# Patient Record
Sex: Female | Born: 1965 | Race: Black or African American | Hispanic: No | Smoking: Never smoker
Health system: Southern US, Community
[De-identification: ages and names within clinical notes are randomized; demographics above are authoritative.]

## PROBLEM LIST (undated history)

## (undated) DIAGNOSIS — T7840XA Allergy, unspecified, initial encounter: Secondary | ICD-10-CM

## (undated) DIAGNOSIS — G43909 Migraine, unspecified, not intractable, without status migrainosus: Secondary | ICD-10-CM

## (undated) DIAGNOSIS — I10 Essential (primary) hypertension: Secondary | ICD-10-CM

## (undated) DIAGNOSIS — M1990 Unspecified osteoarthritis, unspecified site: Secondary | ICD-10-CM

## (undated) DIAGNOSIS — E669 Obesity, unspecified: Secondary | ICD-10-CM

## (undated) DIAGNOSIS — F32A Depression, unspecified: Secondary | ICD-10-CM

## (undated) DIAGNOSIS — E559 Vitamin D deficiency, unspecified: Secondary | ICD-10-CM

## (undated) DIAGNOSIS — F329 Major depressive disorder, single episode, unspecified: Secondary | ICD-10-CM

## (undated) DIAGNOSIS — F439 Reaction to severe stress, unspecified: Secondary | ICD-10-CM

## (undated) HISTORY — DX: Major depressive disorder, single episode, unspecified: F32.9

## (undated) HISTORY — PX: MOUTH SURGERY: SHX715

## (undated) HISTORY — DX: Reaction to severe stress, unspecified: F43.9

## (undated) HISTORY — DX: Obesity, unspecified: E66.9

## (undated) HISTORY — PX: OTHER SURGICAL HISTORY: SHX169

## (undated) HISTORY — DX: Depression, unspecified: F32.A

## (undated) HISTORY — DX: Unspecified osteoarthritis, unspecified site: M19.90

## (undated) HISTORY — DX: Vitamin D deficiency, unspecified: E55.9

## (undated) HISTORY — DX: Allergy, unspecified, initial encounter: T78.40XA

## (undated) HISTORY — DX: Migraine, unspecified, not intractable, without status migrainosus: G43.909

---

## 1998-08-12 ENCOUNTER — Inpatient Hospital Stay (HOSPITAL_COMMUNITY): Admission: AD | Admit: 1998-08-12 | Discharge: 1998-08-12 | Payer: Self-pay | Admitting: Obstetrics and Gynecology

## 1999-10-19 ENCOUNTER — Ambulatory Visit: Admission: RE | Admit: 1999-10-19 | Discharge: 1999-10-19 | Payer: Self-pay | Admitting: Internal Medicine

## 1999-10-23 ENCOUNTER — Emergency Department (HOSPITAL_COMMUNITY): Admission: EM | Admit: 1999-10-23 | Discharge: 1999-10-23 | Payer: Self-pay | Admitting: Emergency Medicine

## 1999-12-04 ENCOUNTER — Emergency Department (HOSPITAL_COMMUNITY): Admission: EM | Admit: 1999-12-04 | Discharge: 1999-12-04 | Payer: Self-pay | Admitting: Emergency Medicine

## 2000-09-25 ENCOUNTER — Encounter (INDEPENDENT_AMBULATORY_CARE_PROVIDER_SITE_OTHER): Payer: Self-pay

## 2000-09-25 ENCOUNTER — Ambulatory Visit (HOSPITAL_COMMUNITY): Admission: RE | Admit: 2000-09-25 | Discharge: 2000-09-25 | Payer: Self-pay | Admitting: Obstetrics and Gynecology

## 2000-10-07 ENCOUNTER — Encounter: Payer: Self-pay | Admitting: Cardiovascular Disease

## 2000-10-07 ENCOUNTER — Ambulatory Visit (HOSPITAL_COMMUNITY): Admission: RE | Admit: 2000-10-07 | Discharge: 2000-10-07 | Payer: Self-pay | Admitting: Cardiovascular Disease

## 2000-10-13 ENCOUNTER — Encounter: Payer: Self-pay | Admitting: Cardiovascular Disease

## 2000-10-13 ENCOUNTER — Encounter: Admission: RE | Admit: 2000-10-13 | Discharge: 2000-10-13 | Payer: Self-pay | Admitting: Cardiovascular Disease

## 2001-09-25 ENCOUNTER — Encounter: Payer: Self-pay | Admitting: Internal Medicine

## 2001-09-25 ENCOUNTER — Ambulatory Visit (HOSPITAL_COMMUNITY): Admission: RE | Admit: 2001-09-25 | Discharge: 2001-09-25 | Payer: Self-pay | Admitting: Internal Medicine

## 2001-10-13 ENCOUNTER — Other Ambulatory Visit: Admission: RE | Admit: 2001-10-13 | Discharge: 2001-10-13 | Payer: Self-pay | Admitting: Obstetrics and Gynecology

## 2002-11-02 ENCOUNTER — Other Ambulatory Visit: Admission: RE | Admit: 2002-11-02 | Discharge: 2002-11-02 | Payer: Self-pay | Admitting: Obstetrics and Gynecology

## 2003-09-30 ENCOUNTER — Other Ambulatory Visit: Admission: RE | Admit: 2003-09-30 | Discharge: 2003-09-30 | Payer: Self-pay | Admitting: Obstetrics and Gynecology

## 2004-06-29 ENCOUNTER — Ambulatory Visit (HOSPITAL_COMMUNITY): Admission: RE | Admit: 2004-06-29 | Discharge: 2004-06-29 | Payer: Self-pay | Admitting: Cardiovascular Disease

## 2004-07-26 ENCOUNTER — Ambulatory Visit (HOSPITAL_COMMUNITY): Admission: RE | Admit: 2004-07-26 | Discharge: 2004-07-26 | Payer: Self-pay | Admitting: Obstetrics and Gynecology

## 2005-01-22 ENCOUNTER — Other Ambulatory Visit: Admission: RE | Admit: 2005-01-22 | Discharge: 2005-01-22 | Payer: Self-pay | Admitting: Obstetrics and Gynecology

## 2005-05-31 ENCOUNTER — Inpatient Hospital Stay (HOSPITAL_COMMUNITY): Admission: AD | Admit: 2005-05-31 | Discharge: 2005-06-03 | Payer: Self-pay | Admitting: Obstetrics and Gynecology

## 2005-06-05 ENCOUNTER — Ambulatory Visit (HOSPITAL_COMMUNITY): Admission: RE | Admit: 2005-06-05 | Discharge: 2005-06-05 | Payer: Self-pay | Admitting: Obstetrics and Gynecology

## 2005-06-05 ENCOUNTER — Ambulatory Visit: Payer: Self-pay | Admitting: Obstetrics and Gynecology

## 2005-06-28 ENCOUNTER — Inpatient Hospital Stay (HOSPITAL_COMMUNITY): Admission: AD | Admit: 2005-06-28 | Discharge: 2005-07-02 | Payer: Self-pay | Admitting: Obstetrics & Gynecology

## 2005-07-03 ENCOUNTER — Encounter: Admission: RE | Admit: 2005-07-03 | Discharge: 2005-08-02 | Payer: Self-pay | Admitting: Obstetrics and Gynecology

## 2005-08-03 ENCOUNTER — Encounter: Admission: RE | Admit: 2005-08-03 | Discharge: 2005-08-30 | Payer: Self-pay | Admitting: Obstetrics and Gynecology

## 2005-08-31 ENCOUNTER — Encounter: Admission: RE | Admit: 2005-08-31 | Discharge: 2005-09-10 | Payer: Self-pay | Admitting: Obstetrics and Gynecology

## 2007-07-25 ENCOUNTER — Emergency Department (HOSPITAL_COMMUNITY): Admission: EM | Admit: 2007-07-25 | Discharge: 2007-07-26 | Payer: Self-pay | Admitting: Emergency Medicine

## 2008-03-08 ENCOUNTER — Encounter: Admission: RE | Admit: 2008-03-08 | Discharge: 2008-03-08 | Payer: Self-pay | Admitting: Obstetrics and Gynecology

## 2009-04-13 ENCOUNTER — Ambulatory Visit (HOSPITAL_COMMUNITY): Admission: RE | Admit: 2009-04-13 | Discharge: 2009-04-13 | Payer: Self-pay | Admitting: Internal Medicine

## 2009-07-27 ENCOUNTER — Emergency Department (HOSPITAL_COMMUNITY): Admission: EM | Admit: 2009-07-27 | Discharge: 2009-07-27 | Payer: Self-pay | Admitting: Emergency Medicine

## 2010-02-12 ENCOUNTER — Emergency Department (HOSPITAL_COMMUNITY): Admission: EM | Admit: 2010-02-12 | Discharge: 2010-02-13 | Payer: Self-pay | Admitting: Emergency Medicine

## 2010-07-07 ENCOUNTER — Encounter: Payer: Self-pay | Admitting: Obstetrics and Gynecology

## 2010-09-26 ENCOUNTER — Encounter: Payer: Self-pay | Admitting: Internal Medicine

## 2010-11-02 NOTE — Op Note (Signed)
[facility]  Patient:    Kristin Lozano, Kristin Lozano Visit Number: [id number] MRN: [id number]          Service Type: DSU Location: [facility] Attending Physician:  Lendon Colonel Admit Date:  09/25/2000                             Operative Report  PREOPERATIVE DIAGNOSIS:       Menorrhagia and uterine fibroids.  POSTOPERATIVE DIAGNOSIS:      Menorrhagia and uterine fibroids.  OPERATION:                    Hysteroscopy with resection of a large submucous myoma.  SURGEON:                      Katherine Roan, M.D.  DESCRIPTION OF PROCEDURE:     Under satisfactory general anesthesia in the lithotomy position, the cervix was dilated. The hysteroscope was inserted into the uterus and visualization of the uterine cavity revealed a large, pedunculated myoma, which was resected.  The uterus was injected with a total of about 30 cc of a Pitressin solution.  The patient tolerated this procedure well.  Fluid deficit was around 400 cc. Attending Physician:  Lendon Colonel DD:  09/25/00 TD:  09/25/00 Job: [id number]

## 2010-11-02 NOTE — Op Note (Signed)
NAMETURQUOISE, ESCH      ACCOUNT NO.:  [id number]   MEDICAL RECORD NO.:  [id number]          PATIENT TYPE:  INP   LOCATION:  9198                          FACILITY:  [facility]   PHYSICIAN:  Freddy Finner, M.D.   DATE OF BIRTH:  28-Sep-1965   DATE OF PROCEDURE:  06/28/2005  DATE OF DISCHARGE:                                 OPERATIVE REPORT   PREOPERATIVE DIAGNOSES:  1.  Intrauterine pregnancy at 90 and two-sevenths weeks gestation.  2.  Hemolysis, elevated liver function, and low platelet syndrome.   POSTOPERATIVE DIAGNOSES:  1.  Intrauterine pregnancy at 102 and two-sevenths weeks gestation.  2.  Hemolysis, elevated liver function, and low platelet syndrome.   OPERATIVE PROCEDURE:  Primary low transverse cervical cesarean section with  delivery of viable female infant, Apgars of 5 and 7 by NICU team in  attendance. Arterial cord pH of 7.18.   ESTIMATED INTRAOPERATIVE BLOOD LOSS:  Less than or equal to 800 mL.   INTRAOPERATIVE COMPLICATIONS:  None.   SECONDARY DIAGNOSIS:  Intramural fibroids.   ANESTHESIA:  Spinal.   The patient is a 45 year old who presented to the office on the day of  admission complaining of severe headache of 2 days duration. She was sent to  the [facility] where laboratory evaluation revealed marked elevation  of liver function studies and elevation of uric acid to 6. Her platelet  count was normal. 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   She was brought to the operating room, placed under adequate spinal  anesthesia, placed in the dorsal recumbent position with elevation of the  right hip by 15 degrees. The abdomen was prepped and draped in the usual  fashion, Foley catheter placed using sterile technique. Sterile drapes were  applied. A lower abdominal transverse incision was  made and carried sharply  down to the fascia. Subcutaneous vessels were controlled with the Bovie. The  fascia was entered sharply and extended to the extent of the skin incision.  The rectus sheath was developed superiorly and inferiorly with blunt and  sharp dissection, the rectus muscles were divided in the midline. The  peritoneum was elevated, entered sharply, and extended bluntly to the extent  of skin incision. A transverse incision was made in the visceral peritoneum  overlying the lower uterine segment and the bladder bluntly dissected off  the lower segment. There was marked vascularity consistent with a low-lying  anterior placenta noted by previous ultrasound. The most avascular area was  picked and a transverse incision was made and carried sharply and bluntly  down to amnion, which was entered. The incision was extended transversely  with blunt dissection. A viable female infant was then delivered without  significant difficulty. The floating vertex was unengaged and required  vacuum extraction to deliver the infant. Apgars and cord pH are noted above.  Arterial and venous cord blood samples were taken and submitted. The uterus  was then delivered through the abdominal incision. There was in intramural  myoma measuring approximately 6 cm on the lower uterine segment to the left  of midline. There were one or two other fibroids that were intramural also.  The placenta and other products of conception were removed from the uterus.  This was confirmed by manual exploration of the uterine cavity. The uterine  incision was then closed in a double layer with a running locking 0 Monocryl  for the first layer and an imbricating 0 Monocryl for the second layer. The  bladder flap was reapproximated with an interrupted figure-of-eight of 0  Monocryl. The uterus was placed back into the abdominal cavity. Please note  that tubes and ovaries were normal. Irrigation was carried out.  Hemostasis  was complete. All pack, needle, and instrument counts were correct. The  abdominal incision was then closed in layers. Running 0 Monocryl was used to  close the peritoneum and reapproximate the rectus muscles. The fascia was  closed with running 0 PDS running from angle to angle on either side. The  subcutaneous tissue was approximated with running 0 plain. Skin was closed  with wide skin staples and quarter-inch Steri-Strips. The patient tolerated  the operative procedure well. She was given a gram of Ancef with clamping of  the cord. The plan is to start her on IV magnesium sulfate in the recovery  room and to observe her in the AICU overnight or as needed for a longer  period of time.      Freddy Finner, M.D.  Electronically Signed     WRN/MEDQ  D:  06/28/2005  T:  06/28/2005  Job:  [id number]

## 2010-11-02 NOTE — Discharge Summary (Signed)
Kristin Lozano, Kristin Lozano      ACCOUNT NO.:  [id number]   MEDICAL RECORD NO.:  [id number]          PATIENT TYPE:  INP   LOCATION:  9152                          FACILITY:  [facility]   PHYSICIAN:  Duke Salvia. Marcelle Overlie, M.D.DATE OF BIRTH:  1965/08/19   DATE OF ADMISSION:  05/31/2005  DATE OF DISCHARGE:  06/03/2005                                 DISCHARGE SUMMARY   ADMITTING DIAGNOSES:  1.  Intrauterine pregnancy at 67 and two-sevenths weeks estimated      gestational age.  2.  Declining amniotic fluid index.  3.  History of hypertension.  4.  Low-lying placenta with possible vasoprevia.  5.  Fibroid uterus.   DISCHARGE DIAGNOSES:  1.  Intrauterine pregnancy at 72 and five-sevenths weeks.  2.  Normal amniotic fluid index.   REASON FOR ADMISSION:  Please see dictated H&P.   HOSPITAL COURSE:  The patient is a 45 year old married black female gravida  3 para 2 that was admitted to [facility] for further  evaluation due to declining amniotic fluid index. Placenta was also noted to  be a grade 2 bordered on grade 3. The umbilical cord was also noted to be  between the fetal vertex and the cervix with questionable vasoprevia. The  patient denied any leakage of fluid, headache, blurred vision or epigastric  pain. Blood pressure was normal 110/70 with trace proteinuria. On admission  vital signs were stable. Fetal heart tones were reactive. The patient was  not noted to have any uterine contractions. PIH labs were within normal  limits. Ultrasound was performed which revealed AFI of 14.3, vertex  presentation, with placenta noted to be grade 2 to 3. Biophysical profile  was 8/8. Doppler study did reveal umbilical artery S/D ratio of 3.01 which  was within normal limits. Cervix was noted to have 3.1 cm cervical length  and uterine fibroid was measured at 4.5 cm. On the following morning the  patient was noted with stable vital signs. She was afebrile. Fetal heart  tones  continued to be reactive with an occasional 10-beats-per-minute  deceleration. Ultrasound was scheduled for reevaluation of amniotic fluid  index. The patient was started on betamethasone and 24-hour urine  collection. She continued on modified bedrest with IV fluids. Ultrasound was  performed which revealed stable AFI of 11.4. On the following morning vital  signs remained stable, fetal heart tones were reactive. A 24-hour urine  revealed 172 mg per day of protein. Other chemistries were normal. Group B  beta strep culture had been obtained which was negative. On the following  morning ultrasound revealed stable AFI of 11.5 with biophysical profile 6/8.  NST was reactive. The patient was later discharged home on bedrest with  increase in p.o. fluids, with follow-up in the office in approximately 2  days for biophysical profile and Doppler studies.   CONDITION ON DISCHARGE:  Stable.   DIET:  Regular as tolerated.   ACTIVITY:  Modified bedrest.   FOLLOW UP:  The patient is to follow up in the office in 2 days for an OB  check, biophysical profile and Doppler studies.   DISCHARGE MEDICATIONS:  Prenatal vitamins one p.o.  daily.      Julio Sicks, N.P.      Richard M. Marcelle Overlie, M.D.  Electronically Signed    CC/MEDQ  D:  06/20/2005  T:  06/20/2005  Job:  [id number]

## 2010-11-02 NOTE — H&P (Signed)
Kristin Lozano, Kristin Lozano      ACCOUNT NO.:  [id number]   MEDICAL RECORD NO.:  [id number]          PATIENT TYPE:  INP   LOCATION:  9198                          [facility]   PHYSICIAN:  Freddy Finner, M.D.   DATE OF BIRTH:  05/02/1966   DATE OF ADMISSION:  06/28/2005  DATE OF DISCHARGE:                                HISTORY & PHYSICAL   ADMITTING DIAGNOSES:  1.  Intrauterine pregnancy at 27 and two-sevenths weeks gestation.  2.  Pregnancy-induced hypertension with impending hemolysis, elevated liver      function, and low platelet syndrome and marked elevation of liver      function studies.   The patient is a 45 year old black married female gravida 3 para 0 who has  had prenatal course complicated by low AFI with admission to the hospital in  mid December and subsequent discharge after hydration and bedrest. She has  been followed in the office with ultrasounds including one done on January 4  which showed improving AFI and normal biophysical profile. At various times  umbilical artery flow studies have been done which were normal. She  presented on the day of admission complaining of a severe headache which had  started on the day prior to this visit, which she questioned as being sinus  in etiology. Her blood pressure was noted to be 122/90 in the office. There  was no proteinuria but because of her symptoms, she was sent to [facility] for nonstress testing which did show a very reactive pattern, and  for pregnancy-induced hypertension labs which showed marked elevation of  SGOT, SGPT, and uric acid to 6.0. On further review of systems she does  admit to some epigastric pain. Serial blood pressures at the hospital were  127/83, 135/90, and 141/81. Other labs included a normal platelet count of  238,000; coags are pending at the time of this dictation. Based on this  marked elevation of liver function studies and what is considered to be  impending or early HELLP  syndrome, she is now admitted for delivery.   Current review of systems is negative except as noted above.   PAST MEDICAL HISTORY:  Recorded in the prenatal summary, will not be  repeated. She does have allergies to PENICILLIN, SULFA, MYCIN DRUGS, and  ASPIRIN.   PHYSICAL EXAMINATION:  HEENT:  Grossly within normal limits.  VITAL SIGNS:  Her blood pressures are noted above.  GENERAL:  The patient is alert, oriented, and in no acute distress at the  time of exam. Her headache has been relieved some by Tylenol. Her deep  tendon reflexes are +2.  NECK:  Thyroid gland is not palpably enlarged.  CHEST:  Clear to auscultation throughout.  HEART:  Normal sinus rhythm without murmurs, rubs, or gallops.  ABDOMEN:  Gravid, appropriate for gestational age. There is mild epigastric  tenderness to deep palpation.  PELVIC:  There is no presenting part in the pelvis. The cervix is long and  closed.  EXTREMITIES:  Without cyanosis or clubbing. There is 1+ edema.   ASSESSMENT:  1.  Intrauterine pregnancy at 58 and two-sevenths weeks gestation.  2.  Hemolysis, elevated liver function, and low platelet syndrome.   PLAN:  Cesarean delivery.      Freddy Finner, M.D.  Electronically Signed     WRN/MEDQ  D:  06/28/2005  T:  06/28/2005  Job:  [id number]

## 2010-11-02 NOTE — Discharge Summary (Signed)
Kristin Lozano, Kristin Lozano      ACCOUNT NO.:  [id number]   MEDICAL RECORD NO.:  [id number]          PATIENT TYPE:  INP   LOCATION:  9318                          [facility]   PHYSICIAN:  Zelphia Cairo, MD    DATE OF BIRTH:  1965-11-14   DATE OF ADMISSION:  06/28/2005  DATE OF DISCHARGE:  07/02/2005                                 DISCHARGE SUMMARY   ADMITTING DIAGNOSES:  1.  Intrauterine pregnancy at 33-2/7 weeks estimated gestational age.  2.  HELLP syndrome.   DISCHARGE DIAGNOSIS:  Status post low transverse cesarean section and viable  female infant.   PROCEDURE:  Primary low transverse cesarean section.   REASON FOR ADMISSION:  Please see dictated H&P.   HOSPITAL COURSE:  The patient was a 45 year old married black female gravida  3, para 0 who presented to the office on the day of admission complaining of  a severe headache 2 days in duration. The patient was then sent to [facility] where laboratory evaluation revealed marked elevation of her liver  function tests and elevated uric acid levels. Platelet count was normal.  Based on her clinical presentation and laboratory data, decision was made to  proceed with a primary low transverse cesarean section. The patient was then  taken to the operating room where spinal anesthesia was administered without  difficulty. Low transverse incision was made with delivery of a viable  female infant weighing 4 pounds 5 ounces with Apgars of 5 at 1 and 7 at 10  minutes. Arterial cord pH of 7.18. The patient tolerated procedure well and  was taken to the recovery room in stable condition. The patient was later  transferred to the intensive care unit where she was followed closely. The  patient on the following morning was without complaint. However, she was  having some difficulty focusing. Blood pressure was 108/70. She was  afebrile. Urine output was noted to be 1500. Abdomen was soft. Fundus firm  with mild tenderness  noted with palpation. Scant drainage was noted on the  abdominal dressing. Laboratory findings revealed uric acid was 6.7. SGOT was  473, SGPT 769. Coags were within normal limits. Later that evening the  patient was without complaint, right upper quadrant discomfort was  improving. The blood pressure was stable. However, the patient had been  unable to void and catheter was inserted for monitoring of her output. On  the following morning the patient did complain of some fullness in the  epigastrium. She had a history of reflux. Blood pressure was 124/30. She was  afebrile. Liver function tests continued to be decreasing. Fundus firm and  nontender. The patient was tearful and was now started on some Zoloft 50  milligrams q.d. and was started on Nexium for reflux. On the following  morning the patient had now been transferred to the mother baby unit. She  was without complaint. Vital signs remained stable. Blood pressure 117/60.  Fundus firm and nontender. Incision was clean, dry and intact. Laboratory  findings revealed hemoglobin 9.9, platelet count of 226,000, SGOT was 99 and  SGPT 277. On the following morning the patient was feeling better. She  is  now tolerating a regular diet without complaints of nausea, vomiting, pain  was well controlled. She denied right upper quadrant pain, headache or  blurred vision. Vital signs were stable. Blood pressure 120-140s/80s.  Abdomen slightly distended and nontender. Incision was clean, dry and  intact. PIH labs were redrawn with plan of discharging the patient later  that evening. Liver function tests continued to trend downward. Later that  evening PIH labs continued to unfavorable with SGOT of 73, SGPT of 195.  Instructions were given and the patient was later discharged home.   CONDITION ON DISCHARGE:  Stable.   DIET:  Regular as tolerated.   ACTIVITY:  No heavy lifting, no driving x2 weeks, no vaginal entry.   FOLLOW UP:  Patient is to  follow up in the office in 1 week. She is to call  for temperature greater than 100 degrees, persistent nausea and vomiting,  heavy vaginal bleeding and/or redness or drainage from incisional site. The  patient was also instructed to call for headache, blurred vision or right  upper quadrant pain.   DISCHARGE MEDICATIONS:  1.  Tylox #30 one p.o. every 4 to 6 hours p.r.n.  2.  Prenatal vitamins one p.o. daily.  3.  Colace one p.o. daily p.r.n.      Julio Sicks, N.P.      Zelphia Cairo, MD  Electronically Signed    CC/MEDQ  D:  07/17/2005  T:  07/17/2005  Job:  [id number]

## 2010-11-02 NOTE — Cardiovascular Report (Signed)
Kristin Lozano, Kristin Lozano      ACCOUNT NO.:  [id number]   MEDICAL RECORD NO.:  [id number]          PATIENT TYPE:  OIB   LOCATION:  2899                         FACILITY:  [facility]   PHYSICIAN:  Ricki Rodriguez, M.D.  DATE OF BIRTH:  09-19-1965   DATE OF PROCEDURE:  06/29/2004  DATE OF DISCHARGE:  06/29/2004                              CARDIAC CATHETERIZATION   PROCEDURE:  Left heart catheterization, selective coronary angiography, left  ventricular function study.   INDICATIONS:  This is a 45 year old black female with substernal chest pain  with activity off and on along with history of hypertension and obesity.   APPROACH:  Right femoral artery using 5 French sheath and catheters.   COMPLICATIONS:  None.   Less than 60 cc of dye was used.  The patient received 5 mg of IV Lopressor  and 2.5 mg Enalaprilat IV for blood pressure control.   HEMODYNAMIC DATA:  The left ventricular pressure was 154/21/27, and aortic  pressure 155/94/123.   LEFT VENTRICULOGRAM:  The left ventriculogram showed normal left ventricular  systolic function.  Ejection fraction of 60%.   CORONARY ANATOMY:  The left main coronary artery was unremarkable.   Left anterior descending coronary artery.  The left anterior descending  coronary artery showed luminal irregularities, otherwise was unremarkable.   Left circumflex coronary artery.  The left circumflex coronary artery was a  small vessel, and its obtuse marginal branch 1 was unremarkable.   The right coronary artery.  The right coronary artery was dominant and a  tortuous vessel, otherwise unremarkable.  Its posterolateral branch and  posterior descending coronary artery were also unremarkable.   IMPRESSION:  1.  Near normal coronaries.  2.  Hypertensive heart disease, with normal systolic function.  3.  Noncardiac chest pain.   RECOMMENDATIONS:  This patient will be treated medically and may undergo  further investigation for noncardiac chest  pain.      ASK/MEDQ  D:  08/15/2004  T:  08/15/2004  Job:  [id number]

## 2010-11-02 NOTE — H&P (Signed)
NAME:  Kristin Lozano, Kristin Lozano      ACCOUNT NO.:  [id number]   MEDICAL RECORD NO.:  [id number]          PATIENT TYPE:  INP   LOCATION:                                [facility]   PHYSICIAN:  Guy Sandifer. Henderson Cloud, M.D. DATE OF BIRTH:  [birth year]   DATE OF ADMISSION:  05/31/2005  DATE OF DISCHARGE:                                HISTORY & PHYSICAL   CHIEF COMPLAINT:  Low amniotic fluid.   HISTORY OF PRESENT ILLNESS:  This patient is a 45 year old, married black  female, G3, P2 who has a history of hypertension although no medications  taken during this pregnancy.  She conceived on Clomid and dates are  confirmed with a 12-week ultrasound done in conjunction with a first  trimester nuchal lucency screening.  That screening was consistent with a  trisomy 21 risk of 1 in 3, and a Trisomy 18 and 13 risk of 1 on 2100.  She  has at least two uterine leiomyomata measuring approximately 4 cm and  2.4  cm respectively.  At her visit on May 31, 2005, her blood pressure was  110/70 and she has a trace protein urine on dipstick.  An ultrasound  obtained on May 14, 2005 had been consistent with a low-lying placenta  and an amniotic fluid index in the 35th percentile.  The cord was between  the head and the cervix and there was a suspicion of a vasoprevia.  The  baby's growth was 93rd and 94th percentile.  A followup ultrasound done on  the day of admission revealed the estimated fetal weight to again be 93rd to  95th percentile.  However, the amniotic fluid index was now 10.1 cm which is  the 13th percentile.  The placental grade was grade 2, bordering on grade 3.  The umbilical cord is again noted to be between the fetal vertex and the  cervix.  After careful discussion the patient is being admitted for further  evaluation and observation.  She does deny leaking of fluid, headaches,  blurred vision, and epigastric pain.   For past medical history, past surgical history, family history,  obstetric  history, please see prenatal history and physical.   MEDICATIONS:  Prenatal vitamins.   ALLERGIES:  PENICILLIN, SULFA, ASPIRIN, MYCIN DRUGS.   SOCIAL HISTORY:  The patient denies tobacco, alcohol or drug abuse.   REVIEW OF SYSTEMS:  NEUROLOGIC:  Denies headache.  CARDIOVASCULAR:  Denies  chest pain.  GI:  Denies recent changes in bowel habits.   REVIEW OF SYSTEMS:  NEUROLOGIC:  Denies headache.  CARDIOVASCULAR:  Denies  chest pain.  PULMONARY:  Denies shortness of breath.  GI:  Denies recent  changes in bowel habits.   PHYSICAL EXAMINATION:  VITAL SIGNS:  Height 5 feet 9 inches, weight 243  pounds, blood pressure 110/70.  HEENT:  Without thyromegaly.  LUNGS:  Clear to auscultation.  HEART:  Regular rate and rhythm.  BREASTS:  Not examined.  ABDOMEN:  Gravid, non-tender.  PELVIC:  Cervix not examined.  EXTREMITIES:  Grossly within normal limits.  NEUROLOGIC: Grossly within normal limits.   ASSESSMENT:  1.  Intrauterine pregnancy at 29-2/7 weeks  estimated gestational age.  2.  History of hypertension.  No medications currently.  3.  Fibroid uterus.  4.  Low-lying placenta.  5.  Possible vasoprevia.  6.  Declining amniotic fluid index.  7.  Grade 2 placenta.   PLAN:  The patient will be admitted for modified bed rest, observation, IV  fluids.  Ultrasound with Doppler studies, biophysical profile are ordered.  Will obtain baseline preeclampsia laboratories and begin a 24-hour urine  collection.      Guy Sandifer Henderson Cloud, M.D.  Electronically Signed     JET/MEDQ  D:  05/31/2005  T:  05/31/2005  Job:  [id number]

## 2011-03-08 LAB — DIFFERENTIAL
Lymphocytes Relative: 27
Lymphs Abs: 1.6
Monocytes Absolute: 0.5
Monocytes Relative: 8
Neutro Abs: 3.8
Neutrophils Relative %: 61

## 2011-03-08 LAB — POCT I-STAT CREATININE
Creatinine, Ser: 1
Operator id: 294511

## 2011-03-08 LAB — CBC
Hemoglobin: 11.6 — ABNORMAL LOW
RBC: 4.31
WBC: 6.2

## 2011-03-08 LAB — I-STAT 8, (EC8 V) (CONVERTED LAB)
Acid-Base Excess: 3 — ABNORMAL HIGH
Bicarbonate: 28.8 — ABNORMAL HIGH
Glucose, Bld: 91
Potassium: 3.1 — ABNORMAL LOW
TCO2: 30
pH, Ven: 7.389 — ABNORMAL HIGH

## 2011-03-08 LAB — URINE CULTURE: Colony Count: 30000

## 2011-03-08 LAB — URINALYSIS, ROUTINE W REFLEX MICROSCOPIC
Bilirubin Urine: NEGATIVE
Glucose, UA: NEGATIVE
Hgb urine dipstick: NEGATIVE
Ketones, ur: NEGATIVE
Specific Gravity, Urine: 1.034 — ABNORMAL HIGH
pH: 6.5

## 2011-03-08 LAB — SAMPLE TO BLOOD BANK

## 2011-09-12 LAB — HEPATIC FUNCTION PANEL: ALT: 11 U/L (ref 7–35)

## 2011-09-12 LAB — LIPID PANEL
Cholesterol: 171 mg/dL (ref 0–200)
HDL: 44 mg/dL (ref 35–70)
LDl/HDL Ratio: 3.9
Triglycerides: 54 mg/dL (ref 40–160)

## 2011-09-12 LAB — TSH: TSH: 1.52 u[IU]/mL (ref 0.41–5.90)

## 2011-09-12 LAB — HEMOGLOBIN A1C: Hgb A1c MFr Bld: 5.8 % (ref 4.0–6.0)

## 2011-09-12 LAB — CBC AND DIFFERENTIAL
HCT: 38 % (ref 36–46)
Hemoglobin: 11.9 g/dL — AB (ref 12.0–16.0)

## 2011-09-12 LAB — BASIC METABOLIC PANEL
Glucose: 102 mg/dL
Potassium: 4 mmol/L (ref 3.4–5.3)
Sodium: 140 mmol/L (ref 137–147)

## 2011-12-20 ENCOUNTER — Other Ambulatory Visit: Payer: Self-pay | Admitting: Internal Medicine

## 2011-12-20 DIAGNOSIS — R51 Headache: Secondary | ICD-10-CM

## 2011-12-23 ENCOUNTER — Ambulatory Visit
Admission: RE | Admit: 2011-12-23 | Discharge: 2011-12-23 | Disposition: A | Discharge status: Home / Self Care | Payer: BC Managed Care – PPO | Source: Ambulatory Visit | Attending: Internal Medicine | Admitting: Internal Medicine

## 2011-12-23 DIAGNOSIS — R51 Headache: Secondary | ICD-10-CM

## 2012-08-21 ENCOUNTER — Encounter: Payer: Self-pay | Admitting: Hematology

## 2013-03-07 ENCOUNTER — Emergency Department (HOSPITAL_COMMUNITY)
Admission: EM | Admit: 2013-03-07 | Discharge: 2013-03-07 | Disposition: A | Discharge status: Home / Self Care | Payer: BC Managed Care – PPO | Attending: Emergency Medicine | Admitting: Emergency Medicine

## 2013-03-07 ENCOUNTER — Encounter (HOSPITAL_COMMUNITY): Payer: Self-pay | Admitting: Emergency Medicine

## 2013-03-07 DIAGNOSIS — M25569 Pain in unspecified knee: Secondary | ICD-10-CM | POA: Insufficient documentation

## 2013-03-07 DIAGNOSIS — M25561 Pain in right knee: Secondary | ICD-10-CM

## 2013-03-07 DIAGNOSIS — E669 Obesity, unspecified: Secondary | ICD-10-CM | POA: Insufficient documentation

## 2013-03-07 DIAGNOSIS — Z79899 Other long term (current) drug therapy: Secondary | ICD-10-CM | POA: Insufficient documentation

## 2013-03-07 DIAGNOSIS — I10 Essential (primary) hypertension: Secondary | ICD-10-CM | POA: Insufficient documentation

## 2013-03-07 DIAGNOSIS — J45909 Unspecified asthma, uncomplicated: Secondary | ICD-10-CM | POA: Insufficient documentation

## 2013-03-07 DIAGNOSIS — M7989 Other specified soft tissue disorders: Secondary | ICD-10-CM | POA: Insufficient documentation

## 2013-03-07 DIAGNOSIS — M25469 Effusion, unspecified knee: Secondary | ICD-10-CM | POA: Insufficient documentation

## 2013-03-07 DIAGNOSIS — F329 Major depressive disorder, single episode, unspecified: Secondary | ICD-10-CM | POA: Insufficient documentation

## 2013-03-07 DIAGNOSIS — F3289 Other specified depressive episodes: Secondary | ICD-10-CM | POA: Insufficient documentation

## 2013-03-07 MED ORDER — TRAMADOL HCL 50 MG PO TABS: 50.0000 mg | ORAL_TABLET | Freq: Four times a day (QID) | ORAL | Status: DC | PRN

## 2013-03-07 NOTE — ED Provider Notes (Signed)
CSN: [id number]     Arrival date & time 03/07/13  1126 History  This chart was scribed for non-physician practitioner Wynetta Emery, PA-C working with Ethelda Chick, MD by Valera Castle, ED scribe. This patient was seen in room WTR7/WTR7 and the patient's care was started at 12:02 PM.    Chief Complaint  Patient presents with  . left leg pain     The history is provided by the patient. No language interpreter was used.   HPI Comments: Kristin Lozano is a 47 y.o. female who presents to the Emergency Department complaining of sudden, moderate, cramp-like left leg pain with bilateral lower extremity swelling, onset 1 week ago. She reports a pain of 6/10 currently, and 7/10 at the worst. She reports associated swelling in both her legs and ankles onset 3 days ago. She states she just recently changed jobs and sits a lot. She reports taking Advil for the pain, with little relief. She also reports that she has been sleeping with her legs elevated, and has taken fluid pills, which has helped with reducing the swelling. She denies any calf swelling. She reports pain when she walks, but denies SOB, and any other associated symptoms. She states that she exercies on a regular basis. She denies h/o of DVT, PE, clotting disorder, but reports that her father has. She denies having seen an orthopedist, but reports she does have insurance. Patient has seen her primary care doctor for this, and they have given her a diuretic which is, she is scheduled to follow with him tomorrow for further blood work.    Past Medical History  Diagnosis Date  . Allergy   . Asthma   . Hypertension   . Obesity   . Situational stress   . Depression   . Vitamin D deficiency   . Osteoarthrosis, unspecified whether generalized or localized, unspecified site   . Migraine    History reviewed. No pertinent past surgical history. Family History  Problem Relation Age of Onset  . Arthritis Mother   . Hypertension  Mother   . Diabetes Father    History  Substance Use Topics  . Smoking status: Never Smoker   . Smokeless tobacco: Not on file  . Alcohol Use: No   OB History   Grav Para Term Preterm Abortions TAB SAB Ect Mult Living                 Review of Systems  A complete 10 system review of systems was obtained and all systems are negative except as noted in the HPI and PMH.   Allergies  Clindamycin/lincomycin and Sulfa antibiotics  Home Medications   Current Outpatient Rx  Name  Route  Sig  Dispense  Refill  . citalopram (CELEXA) 40 MG tablet   Oral   Take 40 mg by mouth daily.         . metoprolol succinate (TOPROL-XL) 50 MG 24 hr tablet   Oral   Take 50 mg by mouth 2 (two) times daily. Take with or immediately following a meal.         . spironolactone (ALDACTONE) 25 MG tablet   Oral   Take 25 mg by mouth 2 (two) times daily.          Triage Vitals: BP 111/67  Pulse 59  Temp(Src) 98.3 F (36.8 C) (Oral)  Resp 18  SpO2 100%  Physical Exam  Nursing note and vitals reviewed. Constitutional: She is oriented to person, place, and time.  She appears well-developed and well-nourished. No distress.  HENT:  Head: Normocephalic.  Mouth/Throat: Oropharynx is clear and moist.  Eyes: Conjunctivae and EOM are normal. Pupils are equal, round, and reactive to light.  Cardiovascular: Normal rate.   Pulmonary/Chest: Effort normal and breath sounds normal. No stridor. No respiratory distress. She has no wheezes. She has no rales. She exhibits no tenderness.  Abdominal: Soft.  Musculoskeletal: Normal range of motion.  Right Knee:  No deformity, erythema or abrasions. FROM. No effusion or crepitance. Anterior and posterior drawer show no abnormal laxity. Stable to valgus and varus stress. Joint lines are non-tender. Neurovascularly intact. Pt ambulates with non-antalgic gait.   Patient is tender to palpation in the posterior right knee  No calf asymmetry, superficial  collaterals, palpable cords, edema, Homans sign negative bilaterally.    Neurological: She is alert and oriented to person, place, and time.  Psychiatric: She has a normal mood and affect.    ED Course  Procedures (including critical care time)  DIAGNOSTIC STUDIES: Oxygen Saturation is 100% on room air, normal by my interpretation.    COORDINATION OF CARE: 12:07 PM-Discussed treatment plan which includes advising pt to f/u with an orthopedist after f/u with her PCP. Advised pt that she will be unable to drive with her narcotic prescription.      Labs Review Labs Reviewed - No data to display Imaging Review No results found.  MDM   1. Right knee pain     Filed Vitals:   03/07/13 1154  BP: 111/67  Pulse: 59  Temp: 98.3 F (36.8 C)  TempSrc: Oral  Resp: 18  SpO2: 100%     Kristin Lozano is a 47 y.o. female with posterior right knee pain and bilateral leg swelling. On my exam there is no pitting edema, physical exam is inconsistent with a DVT. Most likely Baker's cyst.  Pt is hemodynamically stable, appropriate for, and amenable to discharge at this time. Pt verbalized understanding and agrees with care plan. All questions answered. Outpatient follow-up and specific return precautions discussed.    Discharge Medication List as of 03/07/2013 12:15 PM    START taking these medications   Details  traMADol (ULTRAM) 50 MG tablet Take 1 tablet (50 mg total) by mouth every 6 (six) hours as needed for pain., Starting 03/07/2013, Until Discontinued, Print        I personally performed the services described in this documentation, which was scribed in my presence. The recorded information has been reviewed and is accurate.  Note: Portions of this report may have been transcribed using voice recognition software. Every effort was made to ensure accuracy; however, inadvertent computerized transcription errors may be present     Wynetta Emery, PA-C 03/07/13 1236

## 2013-03-07 NOTE — ED Notes (Signed)
Per pt, left leg swollen and having pain for 1 week-states it feels like a cramp

## 2013-03-07 NOTE — ED Provider Notes (Signed)
Medical screening examination/treatment/procedure(s) were performed by non-physician practitioner and as supervising physician I was immediately available for consultation/collaboration.  Ethelda Chick, MD 03/07/13 1253

## 2013-03-18 ENCOUNTER — Ambulatory Visit: Payer: Self-pay | Admitting: Podiatry

## 2014-01-05 ENCOUNTER — Emergency Department (HOSPITAL_COMMUNITY): Payer: BC Managed Care – PPO

## 2014-01-05 ENCOUNTER — Emergency Department (HOSPITAL_COMMUNITY)
Admission: EM | Admit: 2014-01-05 | Discharge: 2014-01-05 | Disposition: A | Discharge status: Home / Self Care | Payer: BC Managed Care – PPO | Attending: Emergency Medicine | Admitting: Emergency Medicine

## 2014-01-05 ENCOUNTER — Encounter (HOSPITAL_COMMUNITY): Payer: Self-pay | Admitting: Emergency Medicine

## 2014-01-05 DIAGNOSIS — E669 Obesity, unspecified: Secondary | ICD-10-CM | POA: Insufficient documentation

## 2014-01-05 DIAGNOSIS — I10 Essential (primary) hypertension: Secondary | ICD-10-CM | POA: Insufficient documentation

## 2014-01-05 DIAGNOSIS — K118 Other diseases of salivary glands: Secondary | ICD-10-CM | POA: Insufficient documentation

## 2014-01-05 DIAGNOSIS — Z79899 Other long term (current) drug therapy: Secondary | ICD-10-CM | POA: Insufficient documentation

## 2014-01-05 DIAGNOSIS — Z8739 Personal history of other diseases of the musculoskeletal system and connective tissue: Secondary | ICD-10-CM | POA: Insufficient documentation

## 2014-01-05 DIAGNOSIS — R52 Pain, unspecified: Secondary | ICD-10-CM | POA: Insufficient documentation

## 2014-01-05 DIAGNOSIS — J45909 Unspecified asthma, uncomplicated: Secondary | ICD-10-CM | POA: Insufficient documentation

## 2014-01-05 DIAGNOSIS — R609 Edema, unspecified: Secondary | ICD-10-CM

## 2014-01-05 DIAGNOSIS — F329 Major depressive disorder, single episode, unspecified: Secondary | ICD-10-CM | POA: Insufficient documentation

## 2014-01-05 DIAGNOSIS — M25562 Pain in left knee: Secondary | ICD-10-CM

## 2014-01-05 DIAGNOSIS — F3289 Other specified depressive episodes: Secondary | ICD-10-CM | POA: Insufficient documentation

## 2014-01-05 DIAGNOSIS — M25569 Pain in unspecified knee: Secondary | ICD-10-CM | POA: Insufficient documentation

## 2014-01-05 DIAGNOSIS — Z8669 Personal history of other diseases of the nervous system and sense organs: Secondary | ICD-10-CM | POA: Insufficient documentation

## 2014-01-05 MED ORDER — HYDROCODONE-ACETAMINOPHEN 5-325 MG PO TABS: 1.0000 | ORAL_TABLET | ORAL | Status: DC | PRN

## 2014-01-05 MED ORDER — AMOXICILLIN 500 MG PO CAPS: 500.0000 mg | ORAL_CAPSULE | Freq: Three times a day (TID) | ORAL | Status: DC

## 2014-01-05 NOTE — ED Notes (Signed)
Pt states eh has had a stabbing pain in her left knee for the last two weeks. Yesterday she began having pain in her left jaw. Swelling noted. Denies any dental issues but did state that she had pain when biting down on a chip today.

## 2014-01-05 NOTE — ED Provider Notes (Signed)
CSN: [id number]     Arrival date & time 01/05/14  2131 History  This chart was scribed for Ivonne Andrew, PA, working with Toy Baker, MD by Chestine Spore, ED Scribe. The patient was seen in room WTR6/WTR6 at 10:08 PM.     Chief Complaint  Patient presents with  . Leg Pain  . Jaw Pain     The history is provided by the patient. No language interpreter was used.   HPI Comments: Kristin Lozano is a 48 y.o. female who presents to the Emergency Department complaining of stabbing left knee pain that radiates to the back of the left calf onset 4 weeks. She states that she began having pain in her left jaw yesterday.  She states that she can barely turn her neck and it hurts to touch it. She states that her jaw feels swollen. She states that her jaw pain is worse now than it was earlier today.   She states that when she walks there is not much pain on her knee. She states that when she is at rest her leg hurts more than when she is moving. She states that she has used Advil, Aleve, and heat with no relief for her knee pain.   She denies fever, chills, sweat, dry mouth, dental pain, back pain, and any other associated symptoms. She states that for the last couple weeks she has been sweating a lot, she does not know if this is related to her possibly going through menopause. She denies DM or cholesterol. She states that she has a medical history of HTN that is controlled. She states that she has DM and kidney problems that are in her family medical history. She states that she has lost 50 lbs over the last 1.5 years.    Past Medical History  Diagnosis Date  . Allergy   . Asthma   . Hypertension   . Obesity   . Situational stress   . Depression   . Vitamin D deficiency   . Osteoarthrosis, unspecified whether generalized or localized, unspecified site   . Migraine    History reviewed. No pertinent past surgical history. Family History  Problem Relation Age of Onset  . Arthritis  Mother   . Hypertension Mother   . Diabetes Father    History  Substance Use Topics  . Smoking status: Never Smoker   . Smokeless tobacco: Never Used  . Alcohol Use: No   OB History   Grav Para Term Preterm Abortions TAB SAB Ect Mult Living                 Review of Systems  HENT: Negative for dental problem.   Musculoskeletal: Negative for back pain.  Neurological: Negative for weakness and numbness.  All other systems reviewed and are negative.     Allergies  Clindamycin/lincomycin and Sulfa antibiotics  Home Medications   Prior to Admission medications   Medication Sig Start Date End Date Taking? Authorizing Provider  calcium carbonate (OS-CAL) 600 MG TABS tablet Take 1,200 mg by mouth daily with breakfast.   Yes Historical Provider, MD  citalopram (CELEXA) 40 MG tablet Take 40 mg by mouth daily.   Yes Historical Provider, MD  metoprolol succinate (TOPROL-XL) 50 MG 24 hr tablet Take 50 mg by mouth 2 (two) times daily. Take with or immediately following a meal.   Yes Historical Provider, MD   BP 136/84  Pulse 62  Temp(Src) 98.3 F (36.8 C) (Oral)  Resp 16  Ht 5\' 9"  (1.753 m)  Wt 198 lb (89.812 kg)  BMI 29.23 kg/m2  SpO2 100%  Physical Exam  Nursing note and vitals reviewed. Constitutional: She is oriented to person, place, and time. She appears well-developed and well-nourished. No distress.  HENT:  Head: Normocephalic.  Patient with orthodontic braces upper and lower teeth. There is asymmetrical swelling around the left parotid duct within the left bucca mucosa. There is swelling and tenderness along the left parotid gland at the mandibular angle. No pre-or post a regular adenopathy. No submandibular swelling or adenopathy. No cervical adenopathy.  Cardiovascular: Normal rate and regular rhythm.   Pulmonary/Chest: Effort normal and breath sounds normal. No respiratory distress. She has no wheezes.  Musculoskeletal: Normal range of motion. She exhibits no edema.   There is mild tenderness to the base of the left popliteal fossa area without any mass. Tenderness extends slightly to the proximal gastrocnemius. No other calf tenderness. No swelling or asymmetry. No gross deformity. Negative anterior posterior drawer test. No increased laxity with valgus or varus stress. Normal distal sensations and pulses in the foot. No tenderness at the ankle. In normal hip and low back exam.  Neurological: She is alert and oriented to person, place, and time.  Skin: Skin is warm and dry. No rash noted.  Psychiatric: She has a normal mood and affect. Her behavior is normal.    ED Course  Procedures  DIAGNOSTIC STUDIES: Oxygen Saturation is 100% on room air, normal by my interpretation.    COORDINATION OF CARE: 10:19 PM-the patient seen and evaluated. Patient well appearing does not appear severely ill or toxic. Patient has swelling and tenderness to left parotid gland with some swelling around the parotid duct within the mouth.    x-rays reviewed by me. No concerning findings to explain pain at night. At this time will recommend RICE and ortho follow up.   Imaging Review Dg Knee Complete 4 Views Left  01/05/2014   CLINICAL DATA:  Left knee pain for 1 month.  EXAM: LEFT KNEE - COMPLETE 4+ VIEW  COMPARISON:  Left tibia/fibula radiographs performed 07/25/2007  FINDINGS: There is no evidence of fracture or dislocation. The joint spaces are preserved. No significant degenerative change is seen; the patellofemoral joint is grossly unremarkable in appearance.  No significant joint effusion is seen. The visualized soft tissues are normal in appearance.  IMPRESSION: No evidence of fracture or dislocation.   Electronically Signed   By: Roanna RaiderJeffery  Chang M.D.   On: 01/05/2014 22:57     MDM   Final diagnoses:  Parotid swelling  Knee pain, acute, left     I personally performed the services described in this documentation, which was scribed in my presence. The recorded  information has been reviewed and is accurate.    Angus Sellereter S Anyelina Claycomb, PA-C 01/05/14 2304

## 2014-01-05 NOTE — Discharge Instructions (Signed)
You were seen and evaluated here left face and jaw swelling as well as your left knee pain. Your providers today feel that your face and jaw swelling is caused by a blocked saliva gland. Use hard candies to help remove the blockage. Take the antibiotics as prescribed. Followup with a dentist for continued evaluation and treatment. Your x-rays today of your knee did not show any concerning findings to explain your pains at night. Your providers feel that there may be some inflammation and strain within the knee joint causing your symptoms. Use rest, ice, compression and elevation to reduce pain and swelling. Followup with your primary care provider or orthopedic specialist for continued evaluation and treatment.   Knee Pain The knee is the complex joint between your thigh and your lower leg. It is made up of bones, tendons, ligaments, and cartilage. The bones that make up the knee are:  The femur in the thigh.  The tibia and fibula in the lower leg.  The patella or kneecap riding in the groove on the lower femur. CAUSES  Knee pain is a common complaint with many causes. A few of these causes are:  Injury, such as:  A ruptured ligament or tendon injury.  Torn cartilage.  Medical conditions, such as:  Gout  Arthritis  Infections  Overuse, over training, or overdoing a physical activity. Knee pain can be minor or severe. Knee pain can accompany debilitating injury. Minor knee problems often respond well to self-care measures or get well on their own. More serious injuries may need medical intervention or even surgery. SYMPTOMS The knee is complex. Symptoms of knee problems can vary widely. Some of the problems are:  Pain with movement and weight bearing.  Swelling and tenderness.  Buckling of the knee.  Inability to straighten or extend your knee.  Your knee locks and you cannot straighten it.  Warmth and redness with pain and fever.  Deformity or dislocation of the  kneecap. DIAGNOSIS  Determining what is wrong may be very straight forward such as when there is an injury. It can also be challenging because of the complexity of the knee. Tests to make a diagnosis may include:  Your caregiver taking a history and doing a physical exam.  Routine X-rays can be used to rule out other problems. X-rays will not reveal a cartilage tear. Some injuries of the knee can be diagnosed by:  Arthroscopy a surgical technique by which a small video camera is inserted through tiny incisions on the sides of the knee. This procedure is used to examine and repair internal knee joint problems. Tiny instruments can be used during arthroscopy to repair the torn knee cartilage (meniscus).  Arthrography is a radiology technique. A contrast liquid is directly injected into the knee joint. Internal structures of the knee joint then become visible on X-ray film.  An MRI scan is a non X-ray radiology procedure in which magnetic fields and a computer produce two- or three-dimensional images of the inside of the knee. Cartilage tears are often visible using an MRI scanner. MRI scans have largely replaced arthrography in diagnosing cartilage tears of the knee.  Blood work.  Examination of the fluid that helps to lubricate the knee joint (synovial fluid). This is done by taking a sample out using a needle and a syringe. TREATMENT The treatment of knee problems depends on the cause. Some of these treatments are:  Depending on the injury, proper casting, splinting, surgery, or physical therapy care will be needed.  Give yourself adequate recovery time. Do not overuse your joints. If you begin to get sore during workout routines, back off. Slow down or do fewer repetitions.  For repetitive activities such as cycling or running, maintain your strength and nutrition.  Alternate muscle groups. For example, if you are a weight lifter, work the upper body on one day and the lower body the  next.  Either tight or weak muscles do not give the proper support for your knee. Tight or weak muscles do not absorb the stress placed on the knee joint. Keep the muscles surrounding the knee strong.  Take care of mechanical problems.  If you have flat feet, orthotics or special shoes may help. See your caregiver if you need help.  Arch supports, sometimes with wedges on the inner or outer aspect of the heel, can help. These can shift pressure away from the side of the knee most bothered by osteoarthritis.  A brace called an "unloader" brace also may be used to help ease the pressure on the most arthritic side of the knee.  If your caregiver has prescribed crutches, braces, wraps or ice, use as directed. The acronym for this is PRICE. This means protection, rest, ice, compression, and elevation.  Nonsteroidal anti-inflammatory drugs (NSAIDs), can help relieve pain. But if taken immediately after an injury, they may actually increase swelling. Take NSAIDs with food in your stomach. Stop them if you develop stomach problems. Do not take these if you have a history of ulcers, stomach pain, or bleeding from the bowel. Do not take without your caregiver's approval if you have problems with fluid retention, heart failure, or kidney problems.  For ongoing knee problems, physical therapy may be helpful.  Glucosamine and chondroitin are over-the-counter dietary supplements. Both may help relieve the pain of osteoarthritis in the knee. These medicines are different from the usual anti-inflammatory drugs. Glucosamine may decrease the rate of cartilage destruction.  Injections of a corticosteroid drug into your knee joint may help reduce the symptoms of an arthritis flare-up. They may provide pain relief that lasts a few months. You may have to wait a few months between injections. The injections do have a small increased risk of infection, water retention, and elevated blood sugar levels.  Hyaluronic  acid injected into damaged joints may ease pain and provide lubrication. These injections may work by reducing inflammation. A series of shots may give relief for as long as 6 months.  Topical painkillers. Applying certain ointments to your skin may help relieve the pain and stiffness of osteoarthritis. Ask your pharmacist for suggestions. Many over the-counter products are approved for temporary relief of arthritis pain.  In some countries, doctors often prescribe topical NSAIDs for relief of chronic conditions such as arthritis and tendinitis. A review of treatment with NSAID creams found that they worked as well as oral medications but without the serious side effects. PREVENTION  Maintain a healthy weight. Extra pounds put more strain on your joints.  Get strong, stay limber. Weak muscles are a common cause of knee injuries. Stretching is important. Include flexibility exercises in your workouts.  Be smart about exercise. If you have osteoarthritis, chronic knee pain or recurring injuries, you may need to change the way you exercise. This does not mean you have to stop being active. If your knees ache after jogging or playing basketball, consider switching to swimming, water aerobics, or other low-impact activities, at least for a few days a week. Sometimes limiting high-impact activities will provide  relief.  Make sure your shoes fit well. Choose footwear that is right for your sport.  Protect your knees. Use the proper gear for knee-sensitive activities. Use kneepads when playing volleyball or laying carpet. Buckle your seat belt every time you drive. Most shattered kneecaps occur in car accidents.  Rest when you are tired. SEEK MEDICAL CARE IF:  You have knee pain that is continual and does not seem to be getting better.  SEEK IMMEDIATE MEDICAL CARE IF:  Your knee joint feels hot to the touch and you have a high fever. MAKE SURE YOU:   Understand these instructions.  Will watch your  condition.  Will get help right away if you are not doing well or get worse. Document Released: 03/31/2007 Document Revised: 08/26/2011 Document Reviewed: 03/31/2007 [facility] Patient Information 2015 [place], [place]. This information is not intended to replace advice given to you by your health care provider. Make sure you discuss any questions you have with your health care provider.

## 2014-01-06 NOTE — ED Provider Notes (Signed)
Medical screening examination/treatment/procedure(s) were performed by non-physician practitioner and as supervising physician I was immediately available for consultation/collaboration.  Toy BakerAnthony T Ashley Montminy, MD 01/06/14 [phone]

## 2014-07-26 ENCOUNTER — Other Ambulatory Visit: Payer: Self-pay | Admitting: Obstetrics and Gynecology

## 2014-07-26 DIAGNOSIS — R928 Other abnormal and inconclusive findings on diagnostic imaging of breast: Secondary | ICD-10-CM

## 2014-08-03 ENCOUNTER — Encounter (INDEPENDENT_AMBULATORY_CARE_PROVIDER_SITE_OTHER): Payer: Self-pay

## 2014-08-03 ENCOUNTER — Ambulatory Visit
Admission: RE | Admit: 2014-08-03 | Discharge: 2014-08-03 | Disposition: A | Discharge status: Home / Self Care | Payer: BC Managed Care – PPO | Source: Ambulatory Visit | Attending: Obstetrics and Gynecology | Admitting: Obstetrics and Gynecology

## 2014-08-03 ENCOUNTER — Other Ambulatory Visit: Payer: Self-pay | Admitting: Obstetrics and Gynecology

## 2014-08-03 DIAGNOSIS — R928 Other abnormal and inconclusive findings on diagnostic imaging of breast: Secondary | ICD-10-CM

## 2014-12-26 ENCOUNTER — Ambulatory Visit: Payer: BC Managed Care – PPO

## 2014-12-26 ENCOUNTER — Ambulatory Visit (INDEPENDENT_AMBULATORY_CARE_PROVIDER_SITE_OTHER): Payer: BC Managed Care – PPO | Admitting: Podiatry

## 2014-12-26 ENCOUNTER — Ambulatory Visit (INDEPENDENT_AMBULATORY_CARE_PROVIDER_SITE_OTHER): Payer: BC Managed Care – PPO

## 2014-12-26 VITALS — BP 118/70 | HR 64 | Resp 15 | Ht 69.0 in | Wt 220.0 lb

## 2014-12-26 DIAGNOSIS — M79672 Pain in left foot: Secondary | ICD-10-CM

## 2014-12-26 DIAGNOSIS — M779 Enthesopathy, unspecified: Secondary | ICD-10-CM | POA: Diagnosis not present

## 2014-12-26 DIAGNOSIS — M2011 Hallux valgus (acquired), right foot: Secondary | ICD-10-CM

## 2014-12-26 MED ORDER — TRIAMCINOLONE ACETONIDE 10 MG/ML IJ SUSP
10.0000 mg | Freq: Once | INTRAMUSCULAR | Status: AC
  Administered 2014-12-26: 10 mg

## 2014-12-26 MED ORDER — MELOXICAM 15 MG PO TABS: 15.0000 mg | ORAL_TABLET | Freq: Every day | ORAL | Status: DC

## 2014-12-26 NOTE — Patient Instructions (Signed)

## 2014-12-26 NOTE — Progress Notes (Signed)
   Subjective:    Patient ID: Kristin Lozano, female    DOB: 11-13-1965, 49 y.o.   MRN: [id number]  HPI Patient presents with toe pain in their right foot, great toe. Entire toe hurts. Has been going on for the past 2 months. Pt cannot wear heels. Pt also wants to be checked on their Right foot, great toe and 2nd toe to see if they have an ingrown toenail.   Review of Systems  All other systems reviewed and are negative.      Objective:   Physical Exam        Assessment & Plan:

## 2014-12-27 NOTE — Progress Notes (Signed)
Subjective:     Patient ID: Kristin Lozano, female   DOB: [birth year], 49 y.o.   MRN: [id number]  HPI patient states that she's having pain in the right big toe joint and the right toe and she's not sure what caused it and she's not sure if she might not have an ingrown toenail   Review of Systems  All other systems reviewed and are negative.      Objective:   Physical Exam  Constitutional: She is oriented to person, place, and time.  Cardiovascular: Intact distal pulses.   Musculoskeletal: Normal range of motion.  Neurological: She is oriented to person, place, and time.  Skin: Skin is warm.  Nursing note and vitals reviewed.  neurovascular status found to be intact muscle strength adequate range of motion within normal limits with discomfort in the right first MPJ lateral to medial side with a special or see flexion. Patient had minimal discomfort when the nailbeds are palpated hallux and second nail with mild discomfort on the medial border of the right hallux with incurvation of the bed. Patient has good digital perfusion is well oriented 3     Assessment:     Inflammatory capsulitis with possible structural type limitus condition along with possibility for ingrown toenail right    Plan:     H&P conditions reviewed and at this time I did a careful injection of the right first MPJ 3 Milligan Kenalog 5 mg Xylocaine and advised on reduced activity with gradual increase in activity over the next few days. Reviewed x-rays with patient

## 2015-01-23 ENCOUNTER — Encounter: Payer: BC Managed Care – PPO | Admitting: Podiatry

## 2015-01-27 ENCOUNTER — Other Ambulatory Visit: Payer: Self-pay | Admitting: Cardiology

## 2015-01-27 DIAGNOSIS — R079 Chest pain, unspecified: Secondary | ICD-10-CM

## 2015-02-03 ENCOUNTER — Encounter (HOSPITAL_COMMUNITY)
Admission: RE | Admit: 2015-02-03 | Discharge: 2015-02-03 | Disposition: A | Discharge status: Home / Self Care | Payer: BC Managed Care – PPO | Source: Ambulatory Visit | Attending: Cardiology | Admitting: Cardiology

## 2015-02-03 ENCOUNTER — Encounter (HOSPITAL_COMMUNITY): Payer: BC Managed Care – PPO

## 2015-02-03 DIAGNOSIS — R079 Chest pain, unspecified: Secondary | ICD-10-CM | POA: Insufficient documentation

## 2015-02-07 ENCOUNTER — Encounter (HOSPITAL_COMMUNITY)
Admission: RE | Admit: 2015-02-07 | Discharge: 2015-02-07 | Disposition: A | Discharge status: Home / Self Care | Payer: BC Managed Care – PPO | Source: Ambulatory Visit | Attending: Cardiology | Admitting: Cardiology

## 2015-02-07 ENCOUNTER — Ambulatory Visit (HOSPITAL_COMMUNITY)
Admission: RE | Admit: 2015-02-07 | Discharge: 2015-02-07 | Disposition: A | Discharge status: Home / Self Care | Payer: BC Managed Care – PPO | Source: Ambulatory Visit | Attending: Cardiology | Admitting: Cardiology

## 2015-02-07 DIAGNOSIS — R079 Chest pain, unspecified: Secondary | ICD-10-CM | POA: Diagnosis present

## 2015-02-07 MED ORDER — REGADENOSON 0.4 MG/5ML IV SOLN
0.4000 mg | Freq: Once | INTRAVENOUS | Status: AC
  Administered 2015-02-07: 0.4 mg via INTRAVENOUS

## 2015-02-07 MED ORDER — TECHNETIUM TC 99M SESTAMIBI GENERIC - CARDIOLITE
10.0000 | Freq: Once | INTRAVENOUS | Status: AC | PRN
  Administered 2015-02-07: 10 via INTRAVENOUS

## 2015-02-07 MED ORDER — TECHNETIUM TC 99M SESTAMIBI GENERIC - CARDIOLITE
30.0000 | Freq: Once | INTRAVENOUS | Status: AC | PRN
  Administered 2015-02-07: 30 via INTRAVENOUS

## 2015-02-07 MED ORDER — REGADENOSON 0.4 MG/5ML IV SOLN
INTRAVENOUS | Status: AC
  Filled 2015-02-07: qty 5

## 2015-02-15 NOTE — Progress Notes (Signed)
This encounter was created in error - please disregard.

## 2015-11-16 ENCOUNTER — Emergency Department (HOSPITAL_COMMUNITY)
Admission: EM | Admit: 2015-11-16 | Discharge: 2015-11-16 | Disposition: A | Discharge status: Home / Self Care | Payer: BC Managed Care – PPO | Attending: Internal Medicine | Admitting: Internal Medicine

## 2015-11-16 ENCOUNTER — Emergency Department (HOSPITAL_COMMUNITY): Payer: BC Managed Care – PPO

## 2015-11-16 ENCOUNTER — Encounter (HOSPITAL_COMMUNITY): Payer: Self-pay | Admitting: Emergency Medicine

## 2015-11-16 ENCOUNTER — Emergency Department (HOSPITAL_COMMUNITY)
Admission: EM | Admit: 2015-11-16 | Discharge: 2015-11-16 | Disposition: A | Discharge status: Home / Self Care | Payer: BC Managed Care – PPO | Source: Home / Self Care | Attending: Emergency Medicine | Admitting: Emergency Medicine

## 2015-11-16 ENCOUNTER — Encounter (HOSPITAL_COMMUNITY): Payer: Self-pay

## 2015-11-16 DIAGNOSIS — Z6833 Body mass index (BMI) 33.0-33.9, adult: Secondary | ICD-10-CM

## 2015-11-16 DIAGNOSIS — R062 Wheezing: Secondary | ICD-10-CM | POA: Insufficient documentation

## 2015-11-16 DIAGNOSIS — I10 Essential (primary) hypertension: Secondary | ICD-10-CM | POA: Insufficient documentation

## 2015-11-16 DIAGNOSIS — J45909 Unspecified asthma, uncomplicated: Secondary | ICD-10-CM

## 2015-11-16 DIAGNOSIS — E669 Obesity, unspecified: Secondary | ICD-10-CM | POA: Diagnosis present

## 2015-11-16 DIAGNOSIS — R112 Nausea with vomiting, unspecified: Secondary | ICD-10-CM | POA: Insufficient documentation

## 2015-11-16 DIAGNOSIS — T7840XA Allergy, unspecified, initial encounter: Secondary | ICD-10-CM | POA: Diagnosis present

## 2015-11-16 DIAGNOSIS — Z79899 Other long term (current) drug therapy: Secondary | ICD-10-CM

## 2015-11-16 DIAGNOSIS — B9789 Other viral agents as the cause of diseases classified elsewhere: Principal | ICD-10-CM

## 2015-11-16 DIAGNOSIS — R0602 Shortness of breath: Secondary | ICD-10-CM | POA: Diagnosis present

## 2015-11-16 DIAGNOSIS — J069 Acute upper respiratory infection, unspecified: Secondary | ICD-10-CM | POA: Insufficient documentation

## 2015-11-16 DIAGNOSIS — F329 Major depressive disorder, single episode, unspecified: Secondary | ICD-10-CM

## 2015-11-16 DIAGNOSIS — Z791 Long term (current) use of non-steroidal anti-inflammatories (NSAID): Secondary | ICD-10-CM | POA: Diagnosis not present

## 2015-11-16 DIAGNOSIS — E559 Vitamin D deficiency, unspecified: Secondary | ICD-10-CM | POA: Diagnosis present

## 2015-11-16 DIAGNOSIS — J988 Other specified respiratory disorders: Secondary | ICD-10-CM | POA: Diagnosis not present

## 2015-11-16 DIAGNOSIS — J45901 Unspecified asthma with (acute) exacerbation: Secondary | ICD-10-CM | POA: Diagnosis present

## 2015-11-16 HISTORY — DX: Essential (primary) hypertension: I10

## 2015-11-16 LAB — COMPREHENSIVE METABOLIC PANEL
ALT: 18 U/L (ref 14–54)
ANION GAP: 7 (ref 5–15)
AST: 20 U/L (ref 15–41)
Albumin: 4.8 g/dL (ref 3.5–5.0)
Alkaline Phosphatase: 86 U/L (ref 38–126)
BILIRUBIN TOTAL: 0.7 mg/dL (ref 0.3–1.2)
BUN: 12 mg/dL (ref 6–20)
CHLORIDE: 104 mmol/L (ref 101–111)
CO2: 26 mmol/L (ref 22–32)
Calcium: 9.7 mg/dL (ref 8.9–10.3)
Creatinine, Ser: 1.13 mg/dL — ABNORMAL HIGH (ref 0.44–1.00)
GFR calc Af Amer: >60 mL/min (ref >60)
GFR, EST NON AFRICAN AMERICAN: 55 mL/min — AB (ref >60)
Glucose, Bld: 121 mg/dL — ABNORMAL HIGH (ref 65–99)
POTASSIUM: 3.7 mmol/L (ref 3.5–5.1)
Sodium: 137 mmol/L (ref 135–145)
TOTAL PROTEIN: 9 g/dL — AB (ref 6.5–8.1)

## 2015-11-16 LAB — URINALYSIS, ROUTINE W REFLEX MICROSCOPIC
BILIRUBIN URINE: NEGATIVE
GLUCOSE, UA: NEGATIVE mg/dL
HGB URINE DIPSTICK: NEGATIVE
KETONES UR: NEGATIVE mg/dL
LEUKOCYTES UA: NEGATIVE
Nitrite: NEGATIVE
PH: 7.5 (ref 5.0–8.0)
PROTEIN: NEGATIVE mg/dL
Specific Gravity, Urine: 1.023 (ref 1.005–1.030)

## 2015-11-16 LAB — CBC WITH DIFFERENTIAL/PLATELET
BASOS ABS: 0 10*3/uL (ref 0.0–0.1)
BASOS PCT: 0 %
EOS PCT: 3 %
Eosinophils Absolute: 0.3 10*3/uL (ref 0.0–0.7)
HCT: 38.4 % (ref 36.0–46.0)
Hemoglobin: 12.1 g/dL (ref 12.0–15.0)
LYMPHS PCT: 4 %
Lymphs Abs: 0.5 10*3/uL — ABNORMAL LOW (ref 0.7–4.0)
MCH: 26.6 pg (ref 26.0–34.0)
MCHC: 31.5 g/dL (ref 30.0–36.0)
MCV: 84.4 fL (ref 78.0–100.0)
MONO ABS: 0.5 10*3/uL (ref 0.1–1.0)
MONOS PCT: 5 %
Neutro Abs: 9.3 10*3/uL — ABNORMAL HIGH (ref 1.7–7.7)
Neutrophils Relative %: 88 %
PLATELETS: 256 10*3/uL (ref 150–400)
RBC: 4.55 MIL/uL (ref 3.87–5.11)
RDW: 13.4 % (ref 11.5–15.5)
WBC: 10.6 10*3/uL — ABNORMAL HIGH (ref 4.0–10.5)

## 2015-11-16 LAB — HCG, SERUM, QUALITATIVE: PREG SERUM: NEGATIVE

## 2015-11-16 LAB — I-STAT TROPONIN, ED: TROPONIN I, POC: 0 ng/mL (ref 0.00–0.08)

## 2015-11-16 LAB — LIPASE, BLOOD: LIPASE: 30 U/L (ref 11–51)

## 2015-11-16 LAB — D-DIMER, QUANTITATIVE (NOT AT ARMC): D DIMER QUANT: 1.3 ug{FEU}/mL — AB (ref 0.00–0.50)

## 2015-11-16 MED ORDER — AZITHROMYCIN 250 MG PO TABS: 250.0000 mg | ORAL_TABLET | Freq: Every day | ORAL | Status: AC

## 2015-11-16 MED ORDER — AMLODIPINE BESYLATE 10 MG PO TABS: 10.0000 mg | ORAL_TABLET | Freq: Every day | ORAL | Status: DC

## 2015-11-16 MED ORDER — ONDANSETRON HCL 4 MG/2ML IJ SOLN
4.0000 mg | Freq: Once | INTRAMUSCULAR | Status: AC
  Administered 2015-11-16: 4 mg via INTRAVENOUS
  Filled 2015-11-16: qty 2

## 2015-11-16 MED ORDER — IOPAMIDOL (ISOVUE-370) INJECTION 76%
100.0000 mL | Freq: Once | INTRAVENOUS | Status: AC | PRN
  Administered 2015-11-16: 100 mL via INTRAVENOUS

## 2015-11-16 MED ORDER — DEXAMETHASONE SODIUM PHOSPHATE 10 MG/ML IJ SOLN
10.0000 mg | Freq: Once | INTRAMUSCULAR | Status: AC
  Administered 2015-11-16: 10 mg via INTRAVENOUS
  Filled 2015-11-16: qty 1

## 2015-11-16 MED ORDER — FLUTICASONE-SALMETEROL 500-50 MCG/DOSE IN AEPB: 1.0000 | INHALATION_SPRAY | Freq: Two times a day (BID) | RESPIRATORY_TRACT | Status: AC

## 2015-11-16 MED ORDER — AZITHROMYCIN 250 MG PO TABS: 500.0000 mg | ORAL_TABLET | Freq: Every day | ORAL | Status: DC

## 2015-11-16 MED ORDER — ALBUTEROL SULFATE (2.5 MG/3ML) 0.083% IN NEBU: 5.0000 mg | INHALATION_SOLUTION | Freq: Once | RESPIRATORY_TRACT | Status: DC

## 2015-11-16 MED ORDER — MOMETASONE FURO-FORMOTEROL FUM 200-5 MCG/ACT IN AERO: 2.0000 | INHALATION_SPRAY | Freq: Two times a day (BID) | RESPIRATORY_TRACT | Status: DC

## 2015-11-16 MED ORDER — ALBUTEROL SULFATE HFA 108 (90 BASE) MCG/ACT IN AERS
2.0000 | INHALATION_SPRAY | RESPIRATORY_TRACT | Status: DC | PRN
  Administered 2015-11-16: 2 via RESPIRATORY_TRACT
  Filled 2015-11-16: qty 6.7

## 2015-11-16 MED ORDER — PREDNISONE 50 MG PO TABS: 50.0000 mg | ORAL_TABLET | Freq: Every day | ORAL | Status: DC

## 2015-11-16 MED ORDER — METHYLPREDNISOLONE SODIUM SUCC 125 MG IJ SOLR: 60.0000 mg | Freq: Three times a day (TID) | INTRAMUSCULAR | Status: DC

## 2015-11-16 MED ORDER — PREDNISONE 10 MG PO TABS: 50.0000 mg | ORAL_TABLET | Freq: Every day | ORAL | Status: AC

## 2015-11-16 MED ORDER — ALBUTEROL SULFATE HFA 108 (90 BASE) MCG/ACT IN AERS
4.0000 | INHALATION_SPRAY | Freq: Once | RESPIRATORY_TRACT | Status: AC
  Administered 2015-11-16: 4 via RESPIRATORY_TRACT
  Filled 2015-11-16: qty 6.7

## 2015-11-16 MED ORDER — SODIUM CHLORIDE 0.9 % IV BOLUS (SEPSIS)
1000.0000 mL | Freq: Once | INTRAVENOUS | Status: AC
  Administered 2015-11-16: 1000 mL via INTRAVENOUS

## 2015-11-16 MED ORDER — ALBUTEROL SULFATE (2.5 MG/3ML) 0.083% IN NEBU: 2.5000 mg | INHALATION_SOLUTION | RESPIRATORY_TRACT | Status: DC | PRN

## 2015-11-16 MED ORDER — AEROCHAMBER PLUS FLO-VU MEDIUM MISC: 1.0000 | Freq: Once | Status: DC

## 2015-11-16 MED ORDER — IPRATROPIUM-ALBUTEROL 0.5-2.5 (3) MG/3ML IN SOLN
3.0000 mL | RESPIRATORY_TRACT | Status: AC
  Administered 2015-11-16 (×2): 3 mL via RESPIRATORY_TRACT
  Filled 2015-11-16 (×2): qty 3

## 2015-11-16 MED ORDER — SODIUM CHLORIDE 0.9 % IV SOLN: Freq: Once | INTRAVENOUS | Status: DC

## 2015-11-16 MED ORDER — BENZONATATE 100 MG PO CAPS: 100.0000 mg | ORAL_CAPSULE | Freq: Three times a day (TID) | ORAL | Status: AC

## 2015-11-16 MED ORDER — LORATADINE 10 MG PO TABS: 10.0000 mg | ORAL_TABLET | Freq: Every day | ORAL | Status: AC

## 2015-11-16 MED ORDER — ALBUTEROL SULFATE (2.5 MG/3ML) 0.083% IN NEBU
2.5000 mg | INHALATION_SOLUTION | Freq: Four times a day (QID) | RESPIRATORY_TRACT | Status: DC
  Administered 2015-11-16: 2.5 mg via RESPIRATORY_TRACT
  Filled 2015-11-16: qty 3

## 2015-11-16 MED ORDER — IPRATROPIUM-ALBUTEROL 0.5-2.5 (3) MG/3ML IN SOLN
3.0000 mL | Freq: Once | RESPIRATORY_TRACT | Status: AC
  Administered 2015-11-16: 3 mL via RESPIRATORY_TRACT
  Filled 2015-11-16: qty 3

## 2015-11-16 MED ORDER — PROMETHAZINE-DM 6.25-15 MG/5ML PO SYRP: 5.0000 mL | ORAL_SOLUTION | Freq: Four times a day (QID) | ORAL | Status: AC | PRN

## 2015-11-16 MED ORDER — ALBUTEROL SULFATE (5 MG/ML) 0.5% IN NEBU: 2.5000 mg | INHALATION_SOLUTION | Freq: Four times a day (QID) | RESPIRATORY_TRACT | Status: AC | PRN

## 2015-11-16 MED ORDER — AMLODIPINE BESYLATE 10 MG PO TABS: 10.0000 mg | ORAL_TABLET | Freq: Every day | ORAL | Status: AC

## 2015-11-16 MED ORDER — LORATADINE 10 MG PO TABS
10.0000 mg | ORAL_TABLET | Freq: Every day | ORAL | Status: DC
  Administered 2015-11-16: 10 mg via ORAL
  Filled 2015-11-16: qty 1

## 2015-11-16 NOTE — ED Notes (Signed)
)  2 [place] -5L applied by EMS and continued

## 2015-11-16 NOTE — ED Notes (Signed)
Declined W/C at D/C and was escorted to lobby by RN. 

## 2015-11-16 NOTE — ED Notes (Addendum)
Pt reports increased NV, abd shortness of breath. O2 sat 91% on room air after ambulating to bathroom. O2 sat 95 on 5 l. Also c/o nausea  and weakness . No wheezing noted. Pt requires assistance while ambulating to bathroom. Pt is alert, oriented and cooperative.

## 2015-11-16 NOTE — Consult Note (Addendum)
Consult Note                                                      Kolby Lozano  ZOX:[id number]  DOB: 06-30-1965  DOA: 11/16/2015  PCP: Gwenyth Bender, MD   Outpatient Specialists: None   Requesting physician: EDP  Reason for consultation: Asthma evaluation   History of Present llness   Kristin Lozano is an 50 y.o. 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   Basically presented to the ED with chief complaints of dry cough and shortness of breath, low-grade subjective fever yesterday at home, exposure to sick kids at work.  In the ER workup showed minimal leukocytosis, CMP unremarkable, CT angiogram chest unremarkable, unremarkable chest x-ray, she was initially hypoxic and I was called to admit the patient. When I went to evaluate the patient she was sitting up in the hospital bed with a pulse ox of 95% on room air, she was breathing 16 a minute, she had no wheezing on exam whatsoever, the first question she asked me is when can I go home.  She in my interview denies any headache, no fever or chills, dry cough, shortness of breath and wheezing upon presentation which has completely resolved now, currently no abdominal pain or nausea, no focal weakness. She feels good and would like to go home. She denies any personal or family history of blood clots, no recent travel, no swelling in her legs. No chest pain whatsoever.    Review Of Systems     In addition to the HPI above,     No Fever-chills, No Headache, No changes with Vision or hearing, No problems swallowing food or Liquids, No Chest pain, Dry cough and shortness of breath as above, No Abdominal pain, No Nausea or Vommitting, Bowel movements are regular, No Blood in stool or Urine, No dysuria, No new skin rashes or bruises, No new joints pains-aches,  No new weakness, tingling, numbness in any extremity, No recent weight gain or loss, No polyuria, polydypsia or polyphagia, No significant Mental Stressors.  A full 10 point Review of Systems was done, except as stated above, all other Review of Systems were negative.    Social History   Social History  Substance Use Topics  . Smoking  status: Never Smoker   . Smokeless tobacco: Never Used  . Alcohol Use: No      Family History   Family History  Problem Relation Age of Onset  . Arthritis Mother   . Hypertension Mother   . Diabetes Father       Medications   Prior to Admission medications   Medication Sig Start Date End Date Taking? Authorizing Provider  citalopram (CELEXA) 40 MG tablet Take 40 mg by mouth daily.   Yes Historical Provider, MD  ibuprofen (ADVIL,MOTRIN) 200 MG tablet Take 400 mg by mouth every 6 (six) hours as needed for moderate pain.   Yes Historical Provider, MD  metoprolol succinate (TOPROL-XL) 50 MG 24 hr tablet Take 50 mg by mouth 2 (two) times daily. Take with or immediately following a meal.   Yes Historical Provider, MD  spironolactone (ALDACTONE) 25 MG tablet Take 25 mg by mouth 2 (two) times daily. 11/08/15  Yes Historical Provider, MD  terconazole (TERAZOL 7) 0.4 % vaginal cream Place 1 applicator vaginally daily. For 7 days 11/14/15  Yes Historical Provider, MD  benzonatate (TESSALON) 100 MG capsule Take 1 capsule (100 mg total) by mouth every 8 (eight) hours. 11/16/15   Fayrene Helper, PA-C  promethazine-dextromethorphan (PROMETHAZINE-DM) 6.25-15 MG/5ML syrup Take 5 mLs by mouth 4 (four) times daily as needed for cough.  11/16/15   Fayrene Helper, PA-C    Anti-infectives    Start     Dose/Rate Route Frequency Ordered Stop   11/16/15 1845  azithromycin (ZITHROMAX) tablet 500 mg     500 mg Oral Daily 11/16/15 1839        Scheduled Meds: . AEROCHAMBER PLUS FLO-VU MEDIUM  1 each Other Once  . albuterol  4 puff Inhalation Once  . albuterol  2.5 mg Nebulization Q6H  . amLODipine  10 mg Oral Daily  . azithromycin  500 mg Oral Daily  . loratadine  10 mg Oral Daily  . methylPREDNISolone (SOLU-MEDROL) injection  60 mg Intravenous TID  . mometasone-formoterol  2 puff Inhalation BID  . [START ON 11/17/2015] predniSONE  50 mg Oral Q breakfast   Continuous Infusions:  PRN Meds:.albuterol  Allergies  Allergen Reactions  . Clindamycin/Lincomycin   . Sulfa Antibiotics     Objective   Physical Exam  Vitals  Blood pressure 119/62, pulse 82, temperature 99.1 F (37.3 C), temperature source Oral, resp. rate 19, SpO2 95 % RA.   1. General Middle-aged African American female sitting in hospital bed in no distress,  2. Normal affect and insight, Not Suicidal or Homicidal, Awake Alert, Oriented X 3.  3. No F.N deficits, ALL C.Nerves Intact, Strength 5/5 all 4 extremities, Sensation intact all 4 extremities, Plantars down going.  4. Ears and Eyes appear Normal, Conjunctivae clear, PERRLA. Moist Oral Mucosa.  5. Supple Neck, No JVD, No cervical lymphadenopathy appriciated, No Carotid Bruits.  6. Symmetrical Chest wall movement, Good air movement bilaterally, CTAB. No wheezing.  7. RRR, No Gallops, Rubs or Murmurs, No Parasternal Heave.  8. Positive Bowel Sounds, Abdomen Soft, No tenderness, No organomegaly appriciated,No rebound -guarding or rigidity.  9.  No Cyanosis, Normal Skin Turgor, No Skin Rash or Bruise.  10. Good muscle tone,  joints appear normal , no effusions, Normal ROM.  11. No Palpable Lymph Nodes in Neck or Axillae     Data   CBC  Recent Labs Lab 11/16/15 1441  WBC 10.6*  HGB 12.1   HCT 38.4  PLT 256  MCV 84.4  MCH 26.6  MCHC 31.5  RDW 13.4  LYMPHSABS 0.5*  MONOABS 0.5  EOSABS 0.3  BASOSABS 0.0   ------------------------------------------------------------------------------------------------------------------  Chemistries   Recent Labs Lab 11/16/15 1441  NA 137  K 3.7  CL 104  CO2 26  GLUCOSE 121*  BUN 12  CREATININE 1.13*  CALCIUM 9.7  AST 20  ALT 18  ALKPHOS 86  BILITOT 0.7   ------------------------------------------------------------------------------------------------------------------ estimated creatinine clearance is 74.9 mL/min (by C-G formula based on Cr of 1.13). ------------------------------------------------------------------------------------------------------------------ No results for input(s): TSH, T4TOTAL, T3FREE, THYROIDAB in the last 72 hours.  Invalid input(s): FREET3   Coagulation profile No results for input(s): INR, PROTIME in the last 168 hours. ------------------------------------------------------------------------------------------------------------------- -------------------------------------------------------------------------------------------------------------------  Cardiac Enzymes No results for input(s): CKMB, TROPONINI, MYOGLOBIN in the last 168 hours.  Invalid input(s): CK ------------------------------------------------------------------------------------------------------------------ Invalid input(s): POCBNP   ---------------------------------------------------------------------------------------------------------------  Urinalysis    Component Value Date/Time   COLORURINE YELLOW 11/16/2015 1551   APPEARANCEUR CLEAR 11/16/2015 1551   LABSPEC 1.023 11/16/2015 1551   PHURINE 7.5 11/16/2015 1551   GLUCOSEU NEGATIVE 11/16/2015 1551   HGBUR NEGATIVE 11/16/2015 1551   BILIRUBINUR NEGATIVE 11/16/2015 1551   KETONESUR NEGATIVE 11/16/2015 1551   PROTEINUR NEGATIVE 11/16/2015 1551   UROBILINOGEN 1.0  07/25/2007 2359   NITRITE NEGATIVE 11/16/2015 1551   LEUKOCYTESUR NEGATIVE 11/16/2015 1551     Imaging    Dg Chest 2 View  11/16/2015  CLINICAL DATA:  Patient with cough, congestion and shortness of breath. EXAM: CHEST  2 VIEW COMPARISON:  Chest radiograph 07/27/2009. FINDINGS: Stable cardiac and mediastinal contours. No consolidative pulmonary opacities. No pleural effusion pneumothorax. Thoracic spine degenerative changes. IMPRESSION: No active cardiopulmonary disease. Electronically Signed   By: Annia Beltrew  Davis M.D.   On: 11/16/2015 09:15   Ct Angio Chest Pe W/cm &/or Wo Cm  11/16/2015  CLINICAL DATA:  Shortness of breath, hypoxia and positive D-dimer. Associated nausea, vomiting and weakness. History of asthma and hypertension EXAM: CT ANGIOGRAPHY CHEST WITH CONTRAST TECHNIQUE: Multidetector CT imaging of the chest was performed using the standard protocol during bolus administration of intravenous contrast. Multiplanar CT image reconstructions and MIPs were obtained to evaluate the vascular anatomy. CONTRAST:  71 cc Isovue 370 COMPARISON:  07/25/2007 FINDINGS: Mediastinum/Lymph Nodes: Limited assessment of the small peripheral segmental and subsegmental pulmonary arteries. No significant central or proximal hilar filling defect or large pulmonary embolus. No evidence of right heart strain. Normal heart size. No pericardial or pleural effusion. Trace amount of fluid in the superior pericardial recess about the thoracic aorta, image 31 as before. Aorta appears intact. Negative for dissection. Major branch vessels are patent. No adenopathy. Lungs/Pleura: Mild hypoventilatory changes of the left upper lobe and lingula. Inferior lingula and medial right middle lobe atelectasis noted. Otherwise trachea and central airways appear patent. Trace right pleural effusion. No other pleural abnormality or pneumothorax. No focal pneumonia, or consolidation. No interstitial disease or edema. Upper abdomen: No acute  findings. Musculoskeletal: No chest wall soft tissue abnormality or asymmetry. Minor scoliosis of the spine noted. No acute osseous finding. Review of the MIP images confirms the above findings. IMPRESSION: No significant central or proximal hilar pulmonary embolus. Limited assessment of the smaller pulmonary arteries peripherally. Inferior lingula and medial right middle lobe subsegmental atelectasis. Trace right pleural effusion No other acute intra thoracic process. Electronically Signed   By: Judie PetitM.  Shick M.D.   On: 11/16/2015 17:06    My personal review of EKG: Rhythm NSR, no Acute ST changes  Assessment & Paln  1. Mild asthma exacerbation due to URI/bronchitis. Patient is 95-96% on room air, upon ambulating her pulse ox is in mid 90s also, she is in no distress, no wheezing currently after she received nebulizer treatments initially, no chest pain. She feels close to her baseline and he had to go home. CTA chest unremarkable.  At this time I would recommend patient be discharged on a prednisone taper over 7-10 days, Singulair/Claritin, azithromycin 5 mg daily 5 days, Advair 500-50, switch Toprol-XL to Norvasc 10 mg daily, stop NSAID use. See PCP in 3-5 days. Also one time outpatient pulmonary follow-up. She has Albuterol inhaler.   2. Essential hypertension. Switch Toprol-XL to Norvasc. Continue other home medications.   DC Instructions written for the patient -  See your primary care physician within 3-5 days. Get CBC, BMP in 2 view chest x-ray checked.  Stop Toprol-XL switch to Norvasc 10 mg by mouth daily  Stop NSAID use  Take the following medications -  prednisone taper over 7-10 days, Singulair/Claritin, azithromycin 5 mg daily 5 days, Advair 500-50, switch Toprol-XL to Norvasc 10 mg daily, stop NSAID use. See PCP in 3-5 days. Also one time outpatient pulmonary follow-up.     Thank you for the consult, we will follow the patient with you in the Hospital.   Susa Raring  K M.D on 11/16/2015 at 6:40 PM  Between 7am to 7pm - Pager - [phone]. After 7pm go to [email or web address] - password [email or web address]  [facility]  - Office  [phone]   Thank you for the consult, we will follow the patient with you in the Hospital.

## 2015-11-16 NOTE — Discharge Instructions (Signed)
See your primary care physician within 3-5 days. Get CBC, BMP in 2 view chest x-ray checked.  Stop Toprol-XL switch to Norvasc 10 mg by mouth daily  Stop NSAID use  Take the following medications -  prednisone taper over 7-10 days, Singulair/Claritin, azithromycin 5 mg daily 5 days, Advair 500-50, switch Toprol-XL to Norvasc 10 mg daily, stop NSAID use. See PCP in 3-5 days. Also one time outpatient pulmonary follow-up.  Schedule your albuterol/breathing treatments every 3 hours scheduled for first 48 hours, then use as needed.    Viral Infections A viral infection can be caused by different types of viruses.Most viral infections are not serious and resolve on their own. However, some infections may cause severe symptoms and may lead to further complications. SYMPTOMS Viruses can frequently cause:  Minor sore throat.  Aches and pains.  Headaches.  Runny nose.  Different types of rashes.  Watery eyes.  Tiredness.  Cough.  Loss of appetite.  Gastrointestinal infections, resulting in nausea, vomiting, and diarrhea. These symptoms do not respond to antibiotics because the infection is not caused by bacteria. However, you might catch a bacterial infection following the viral infection. This is sometimes called a "superinfection." Symptoms of such a bacterial infection may include:  Worsening sore throat with pus and difficulty swallowing.  Swollen neck glands.  Chills and a high or persistent fever.  Severe headache.  Tenderness over the sinuses.  Persistent overall ill feeling (malaise), muscle aches, and tiredness (fatigue).  Persistent cough.  Yellow, green, or brown mucus production with coughing. HOME CARE INSTRUCTIONS   Only take over-the-counter or prescription medicines for pain, discomfort, diarrhea, or fever as directed by your caregiver.  Drink enough water and fluids to keep your urine clear or pale yellow. Sports drinks can provide valuable  electrolytes, sugars, and hydration.  Get plenty of rest and maintain proper nutrition. Soups and broths with crackers or rice are fine. SEEK IMMEDIATE MEDICAL CARE IF:   You have severe headaches, shortness of breath, chest pain, neck pain, or an unusual rash.  You have uncontrolled vomiting, diarrhea, or you are unable to keep down fluids.  You or your child has an oral temperature above 102 F (38.9 C), not controlled by medicine.  Your baby is older than 3 months with a rectal temperature of 102 F (38.9 C) or higher.  Your baby is 19 months old or younger with a rectal temperature of 100.4 F (38 C) or higher. MAKE SURE YOU:   Understand these instructions.  Will watch your condition.  Will get help right away if you are not doing well or get worse.   This information is not intended to replace advice given to you by your health care provider. Make sure you discuss any questions you have with your health care provider.   Document Released: 03/13/2005 Document Revised: 08/26/2011 Document Reviewed: 11/09/2014 Elsevier Interactive Patient Education 2016 [employer]  Asthma, Acute Bronchospasm Acute bronchospasm caused by asthma is also referred to as an asthma attack. Bronchospasm means your air passages become narrowed. The narrowing is caused by inflammation and tightening of the muscles in the air tubes (bronchi) in your lungs. This can make it hard to breathe or cause you to wheeze and cough. CAUSES Possible triggers are:  Animal dander from the skin, hair, or feathers of animals.  Dust mites contained in house dust.  Cockroaches.  Pollen from trees or grass.  Mold.  Cigarette or tobacco smoke.  Air pollutants such as dust, household  cleaners, hair sprays, aerosol sprays, paint fumes, strong chemicals, or strong odors.  Cold air or weather changes. Cold air may trigger inflammation. Winds increase molds and pollens in the air.  Strong emotions such as  crying or laughing hard.  Stress.  Certain medicines such as aspirin or beta-blockers.  Sulfites in foods and drinks, such as dried fruits and wine.  Infections or inflammatory conditions, such as a flu, cold, or inflammation of the nasal membranes (rhinitis).  Gastroesophageal reflux disease (GERD). GERD is a condition where stomach acid backs up into your esophagus.  Exercise or strenuous activity. SIGNS AND SYMPTOMS   Wheezing.  Excessive coughing, particularly at night.  Chest tightness.  Shortness of breath. DIAGNOSIS  Your health care provider will ask you about your medical history and perform a physical exam. A chest X-ray or blood testing may be performed to look for other causes of your symptoms or other conditions that may have triggered your asthma attack. TREATMENT  Treatment is aimed at reducing inflammation and opening up the airways in your lungs. Most asthma attacks are treated with inhaled medicines. These include quick relief or rescue medicines (such as bronchodilators) and controller medicines (such as inhaled corticosteroids). These medicines are sometimes given through an inhaler or a nebulizer. Systemic steroid medicine taken by mouth or given through an IV tube also can be used to reduce the inflammation when an attack is moderate or severe. Antibiotic medicines are only used if a bacterial infection is present.  HOME CARE INSTRUCTIONS   Rest.  Drink plenty of liquids. This helps the mucus to remain thin and be easily coughed up. Only use caffeine in moderation and do not use alcohol until you have recovered from your illness.  Do not smoke. Avoid being exposed to secondhand smoke.  You play a critical role in keeping yourself in good health. Avoid exposure to things that cause you to wheeze or to have breathing problems.  Keep your medicines up-to-date and available. Carefully follow your health care provider's treatment plan.  Take your medicine  exactly as prescribed.  When pollen or pollution is bad, keep windows closed and use an air conditioner or go to places with air conditioning.  Asthma requires careful medical care. See your health care provider for a follow-up as advised. If you are more than [redacted] weeks pregnant and you were prescribed any new medicines, let your obstetrician know about the visit and how you are doing. Follow up with your health care provider as directed.  After you have recovered from your asthma attack, make an appointment with your outpatient doctor to talk about ways to reduce the likelihood of future attacks. If you do not have a doctor who manages your asthma, make an appointment with a primary care doctor to discuss your asthma. SEEK IMMEDIATE MEDICAL CARE IF:   You are getting worse.  You have trouble breathing. If severe, call your local emergency services (911 in the [place]  You develop chest pain or discomfort.  You are vomiting.  You are not able to keep fluids down.  You are coughing up yellow, green, brown, or bloody sputum.  You have a fever and your symptoms suddenly get worse.  You have trouble swallowing. MAKE SURE YOU:   Understand these instructions.  Will watch your condition.  Will get help right away if you are not doing well or get worse.   This information is not intended to replace advice given to you by your health care provider.  Make sure you discuss any questions you have with your health care provider.   Document Released: 09/18/2006 Document Revised: 06/08/2013 Document Reviewed: 12/09/2012 Elsevier Interactive Patient Education [employer]2016 [employer]

## 2015-11-16 NOTE — ED Provider Notes (Addendum)
CSN: [id number]     Arrival date & time 11/16/15  1303 History   First MD Initiated Contact with Patient 11/16/15 1337     Chief Complaint  Patient presents with  . Nausea    24 hx of NV  . Emesis    dry heaves  . Shortness of Breath  . Abdominal Cramping  . Weakness     (Consider location/radiation/quality/duration/timing/severity/associated sxs/prior Treatment) HPI 50 year old female who presents with shortness of breath, nausea, and vomiting. [facility] this [facility] after developing one day of cough, congestion, clear mucous, sore throat, runny nose and low grade fever. Diagnosed with viral respiratory infection. Had CXR that was normal. Sent home with supportive care treatments and inhaler. [facility]. C/o pleuritic chest pain this morning, but currently denies chest pain. No LE edema, calf tenderness, hormone use, h/o of PE/DVT or recent immobilization. No known sick contacts. States also having multiple non-bloody nonbilious emesis at home upon [facility].   In triage, hypoxic to 89% on room air and placed on 5L [place].   Past Medical History  Diagnosis Date  . Allergy   . Asthma   . Hypertension   . Obesity   . Situational stress   . Depression   . Vitamin D deficiency   . Osteoarthrosis, unspecified whether generalized or localized, unspecified site   . Migraine    Past Surgical History  Procedure Laterality Date  . Mouth surgery    . Ablasion     Family History  Problem Relation Age of Onset  . Arthritis Mother   . Hypertension Mother   . Diabetes Father    Social History  Substance Use Topics  . Smoking status: Never Smoker   . Smokeless tobacco: Never Used  . Alcohol Use: No   OB History    No data available     Review of Systems 10/14 systems reviewed and are negative other than those stated in the HPI    Allergies  Clindamycin/lincomycin and Sulfa antibiotics  Home Medications   Prior to Admission  medications   Medication Sig Start Date End Date Taking? Authorizing Provider  citalopram (CELEXA) 40 MG tablet Take 40 mg by mouth daily.   Yes Historical Provider, MD  ibuprofen (ADVIL,MOTRIN) 200 MG tablet Take 400 mg by mouth every 6 (six) hours as needed for moderate pain.   Yes Historical Provider, MD  metoprolol succinate (TOPROL-XL) 50 MG 24 hr tablet Take 50 mg by mouth 2 (two) times daily. Take with or immediately following a meal.   Yes Historical Provider, MD  spironolactone (ALDACTONE) 25 MG tablet Take 25 mg by mouth 2 (two) times daily. 11/08/15  Yes Historical Provider, MD  terconazole (TERAZOL 7) 0.4 % vaginal cream Place 1 applicator vaginally daily. For 7 days 11/14/15  Yes Historical Provider, MD  benzonatate (TESSALON) 100 MG capsule Take 1 capsule (100 mg total) by mouth every 8 (eight) hours. 11/16/15   Fayrene HelperBowie Tran, PA-C  promethazine-dextromethorphan (PROMETHAZINE-DM) 6.25-15 MG/5ML syrup Take 5 mLs by mouth 4 (four) times daily as needed for cough. 11/16/15   Fayrene HelperBowie Tran, PA-C   BP 119/62 mmHg  Pulse 105  Temp(Src) 99.1 F (37.3 C) (Oral)  Resp 19  SpO2 96% Physical Exam Physical Exam  Nursing note and vitals reviewed. Constitutional: Well developed, well nourished, non-toxic, and in no acute distress Head: Normocephalic and atraumatic.  Mouth/Throat: Oropharynx is clear and moist  Neck: Normal range of motion.  Neck supple.  Cardiovascular: Tachycardic rate and regular rhythm.  No edema. Pulmonary/Chest: Effort normal, speaks in full sentences, diminished breath sounds throughout. Abdominal: Soft. There is no tenderness. There is no rebound and no guarding.  Musculoskeletal: Normal range of motion.  Neurological: Alert, no facial droop, fluent speech, moves all extremities symmetrically Skin: Skin is warm and dry.  Psychiatric: Cooperative  ED Course  Procedures (including critical care time) Labs Review Labs Reviewed  CBC WITH DIFFERENTIAL/PLATELET - Abnormal;  Notable for the following:    WBC 10.6 (*)    Neutro Abs 9.3 (*)    Lymphs Abs 0.5 (*)    All other components within normal limits  COMPREHENSIVE METABOLIC PANEL - Abnormal; Notable for the following:    Glucose, Bld 121 (*)    Creatinine, Ser 1.13 (*)    Total Protein 9.0 (*)    GFR calc non Af Amer 55 (*)    All other components within normal limits  D-DIMER, QUANTITATIVE (NOT AT [facility]) - Abnormal; Notable for the following:    D-Dimer, Quant 1.30 (*)    All other components within normal limits  LIPASE, BLOOD  HCG, SERUM, QUALITATIVE  URINALYSIS, ROUTINE W REFLEX MICROSCOPIC (NOT AT [facility])  Rosezena Sensor, ED    Imaging Review Dg Chest 2 View  11/16/2015  CLINICAL DATA:  Patient with cough, congestion and shortness of breath. EXAM: CHEST  2 VIEW COMPARISON:  Chest radiograph 07/27/2009. FINDINGS: Stable cardiac and mediastinal contours. No consolidative pulmonary opacities. No pleural effusion pneumothorax. Thoracic spine degenerative changes. IMPRESSION: No active cardiopulmonary disease. Electronically Signed   By: Annia Belt M.D.   On: 11/16/2015 09:15   Ct Angio Chest Pe W/cm &/or Wo Cm  11/16/2015  CLINICAL DATA:  Shortness of breath, hypoxia and positive D-dimer. Associated nausea, vomiting and weakness. History of asthma and hypertension EXAM: CT ANGIOGRAPHY CHEST WITH CONTRAST TECHNIQUE: Multidetector CT imaging of the chest was performed using the standard protocol during bolus administration of intravenous contrast. Multiplanar CT image reconstructions and MIPs were obtained to evaluate the vascular anatomy. CONTRAST:  71 cc Isovue 370 COMPARISON:  07/25/2007 FINDINGS: Mediastinum/Lymph Nodes: Limited assessment of the small peripheral segmental and subsegmental pulmonary arteries. No significant central or proximal hilar filling defect or large pulmonary embolus. No evidence of right heart strain. Normal heart size. No pericardial or pleural effusion. Trace amount of fluid in  the superior pericardial recess about the thoracic aorta, image 31 as before. Aorta appears intact. Negative for dissection. Major branch vessels are patent. No adenopathy. Lungs/Pleura: Mild hypoventilatory changes of the left upper lobe and lingula. Inferior lingula and medial right middle lobe atelectasis noted. Otherwise trachea and central airways appear patent. Trace right pleural effusion. No other pleural abnormality or pneumothorax. No focal pneumonia, or consolidation. No interstitial disease or edema. Upper abdomen: No acute findings. Musculoskeletal: No chest wall soft tissue abnormality or asymmetry. Minor scoliosis of the spine noted. No acute osseous finding. Review of the MIP images confirms the above findings. IMPRESSION: No significant central or proximal hilar pulmonary embolus. Limited assessment of the smaller pulmonary arteries peripherally. Inferior lingula and medial right middle lobe subsegmental atelectasis. Trace right pleural effusion No other acute intra thoracic process. Electronically Signed   By: Judie Petit.  Shick M.D.   On: 11/16/2015 17:06   I have personally reviewed and evaluated these images and lab results as part of my medical decision-making.   EKG Interpretation   Date/Time:  Thursday November 16 2015 13:43:54 EDT Ventricular Rate:  95 PR Interval:  206 QRS Duration: 94 QT Interval:  333 QTC Calculation: 419 R Axis:   81 Text Interpretation:  Sinus rhythm Borderline prolonged PR interval RSR'  in V1 or V2, probably normal variant Left ventricular hypertrophy  Nonspecific T abnormalities, diffuse leads nonspecific TWI in the  infero/anterolateral leads, new since EKG in 2001 Confirmed by Jamielynn Wigley MD,  Renate Danh [phone]) on 11/16/2015 1:56:44 PM Also confirmed by Terion Hedman MD, Emelee Rodocker  [phone]), editor Dan Humphreys, CCT, SANDRA ([id number])  on 11/16/2015 2:30:47 PM      MDM   Final diagnoses:  Asthma exacerbation  Viral respiratory infection    Hospital records reviewed. Normal VS earlier at  [facility] but has 5L oxygen requirement here. No increased work of breathing and no conversational dyspnea, but with poor air movement and diffuse expiratory wheezes on exam. Suspect viral infection as etiology of asthma. Chest x-ray negative from earlier today. Given her drastic worsening of symptoms, new tachycardia and hypoxia, over the course of several hours d-dimer sent and positive. CTA chest obtained, negative for PE, infiltrate, or other acute processes. Had received 3 duonebs and decadron. With improved air movement, scattered wheeze.  No oxygen requirement, and able to ambulate with normal oxygenation. Does have lower pulse ox while sleeping and with persistent tachycardia. Discussed with hospitalist who did not feel she required admission. Made medication recommendations. Strict return and follow-up instructions reviewed. She expressed understanding of all discharge instructions and felt comfortable with the plan of care.    Lavera Guise, MD 11/16/15 1826  Lavera Guise, MD 11/16/15 1919

## 2015-11-16 NOTE — ED Notes (Signed)
Per EMS- pt reports 24 hx of NV and low grade fever and shortness of breath. [facility], discharged with Viral URI. Has not started prescriptions, complained that symptoms have not resolved

## 2015-11-16 NOTE — ED Notes (Signed)
Bed: WA22 Expected date:  Expected time:  Means of arrival:  Comments: Hold for 27 

## 2015-11-16 NOTE — ED Notes (Signed)
Patient here with cough, congestion and sore throat that started yesterday. Cough worse during the night

## 2015-11-16 NOTE — ED Provider Notes (Signed)
CSN: [id number]     Arrival date & time 11/16/15  2878 History   First MD Initiated Contact with Patient 11/16/15 0845     Chief Complaint  Patient presents with  . Cough  . Sore Throat     (Consider location/radiation/quality/duration/timing/severity/associated sxs/prior Treatment) HPI   50 year old female who is obese with history of asthma and allergies presenting with complaints of cold symptoms. Patient states several weeks ago she developed cold symptoms including headache, chest congestion which she was seen by her PCP was given antibiotic and that shortly resolved. Since last night she developed subjective fever, chills, headache, cough productive with white sputum, sinus congestion, throat irritation, pleuritic chest pain shortness of breath and wheezing. Cough was keeping her up at night. No specific treatment tried. She has not used an inhaler in many many years. She denies any prior history of PE or DVT, no recent surgery, prolonged bed rest, unilateral swelling or calf pain. She is a nonsmoker. She denies any recent sick contact.  Past Medical History  Diagnosis Date  . Allergy   . Asthma   . Hypertension   . Obesity   . Situational stress   . Depression   . Vitamin D deficiency   . Osteoarthrosis, unspecified whether generalized or localized, unspecified site   . Migraine    History reviewed. No pertinent past surgical history. Family History  Problem Relation Age of Onset  . Arthritis Mother   . Hypertension Mother   . Diabetes Father    Social History  Substance Use Topics  . Smoking status: Never Smoker   . Smokeless tobacco: Never Used  . Alcohol Use: No   OB History    No data available     Review of Systems  All other systems reviewed and are negative.     Allergies  Clindamycin/lincomycin and Sulfa antibiotics  Home Medications   Prior to Admission medications   Medication Sig Start Date End Date Taking? Authorizing Provider  citalopram  (CELEXA) 40 MG tablet Take 40 mg by mouth daily.    Historical Provider, MD  meloxicam (MOBIC) 15 MG tablet Take 1 tablet (15 mg total) by mouth daily. 12/26/14   Lenn Sink, DPM  metoprolol succinate (TOPROL-XL) 50 MG 24 hr tablet Take 50 mg by mouth 2 (two) times daily. Take with or immediately following a meal.    Historical Provider, MD   BP 148/76 mmHg  Pulse 93  Temp(Src) 99 F (37.2 C) (Oral)  Resp 18  Ht  (1.753 m)  Wt 102.059 kg  BMI 33.21 kg/m2  SpO2 100% Physical Exam  Constitutional: She appears well-developed and well-nourished. No distress.  HENT:  Head: Atraumatic.  Right Ear: External ear normal.  Left Ear: External ear normal.  Nose: Nose normal.  Mouth/Throat: Oropharynx is clear and moist. No oropharyngeal exudate.  Eyes: Conjunctivae are normal.  Neck: Normal range of motion. Neck supple.  No nuchal rigidity  Cardiovascular: Normal rate, regular rhythm and intact distal pulses.   Pulmonary/Chest: Effort normal. She has wheezes (Faint expiratory wheezes but no rales or rhonchi).  Abdominal: Soft. There is no tenderness.  Musculoskeletal: She exhibits no edema.  Neurological: She is alert.  Skin: No rash noted.  Psychiatric: She has a normal mood and affect.  Nursing note and vitals reviewed.   ED Course  Procedures (including critical care time) Labs Review Labs Reviewed - No data to display  Imaging Review Dg Chest 2 View  11/16/2015  CLINICAL DATA:  Patient with cough, congestion and shortness of breath. EXAM: CHEST  2 VIEW COMPARISON:  Chest radiograph 07/27/2009. FINDINGS: Stable cardiac and mediastinal contours. No consolidative pulmonary opacities. No pleural effusion pneumothorax. Thoracic spine degenerative changes. IMPRESSION: No active cardiopulmonary disease. Electronically Signed   By: Annia Beltrew  Davis M.D.   On: 11/16/2015 09:15   I have personally reviewed and evaluated these images and lab results as part of my medical  decision-making.   EKG Interpretation None      MDM   Final diagnoses:  Viral upper respiratory tract infection with cough    BP 148/76 mmHg  Pulse 93  Temp(Src) 99 F (37.2 C) (Oral)  Resp 18  Ht 5\' 9"  (1.753 m)  Wt 102.059 kg  BMI 33.21 kg/m2  SpO2 100%   8:54 AM Patient here with URI symptoms with faint wheezing. Chest x-ray ordered. Albuterol given. She is afebrile with stable normal vital signs and nontoxic in appearance.  9:29 AM Chest x-ray shows no acute pathology. I suspect this is likely a viral etiology. Plan to prescribe symptomatic treatment including Tessalon, and dextromethorphan. Albuterol inhaler given to use as needed. Outpatient follow-up with PCP recommended. Return precaution discussed.  Fayrene HelperBowie Dusti Tetro, PA-C 11/16/15 [phone]  Derwood KaplanAnkit Nanavati, MD 11/16/15 [phone]

## 2015-11-16 NOTE — ED Notes (Signed)
Pt's O2 sat dropped to 86% while sleeping. EDP notified.

## 2015-11-16 NOTE — ED Notes (Signed)
Pt's O2 93% while ambulating on room air

## 2015-11-16 NOTE — ED Notes (Signed)
Bed: WA27 Expected date:  Expected time:  Means of arrival:  Comments: EMS- 50yo F, URI/[facility]

## 2015-11-16 NOTE — Discharge Instructions (Signed)

## 2015-11-16 NOTE — ED Notes (Signed)
Pt transported to CT ?

## 2016-10-08 ENCOUNTER — Other Ambulatory Visit: Payer: Self-pay | Admitting: Cardiology

## 2016-10-08 DIAGNOSIS — R079 Chest pain, unspecified: Secondary | ICD-10-CM

## 2016-10-30 ENCOUNTER — Encounter (HOSPITAL_COMMUNITY): Payer: BC Managed Care – PPO

## 2016-12-02 ENCOUNTER — Ambulatory Visit (HOSPITAL_COMMUNITY)
Admission: RE | Admit: 2016-12-02 | Discharge: 2016-12-02 | Disposition: A | Discharge status: Home / Self Care | Payer: BC Managed Care – PPO | Source: Ambulatory Visit | Attending: Cardiology | Admitting: Cardiology

## 2016-12-02 DIAGNOSIS — R079 Chest pain, unspecified: Secondary | ICD-10-CM | POA: Insufficient documentation

## 2016-12-02 LAB — BASIC METABOLIC PANEL
ANION GAP: 9 (ref 5–15)
BUN: 10 mg/dL (ref 6–20)
CO2: 26 mmol/L (ref 22–32)
Calcium: 10 mg/dL (ref 8.9–10.3)
Chloride: 103 mmol/L (ref 101–111)
Creatinine, Ser: 1.34 mg/dL — ABNORMAL HIGH (ref 0.44–1.00)
GFR calc Af Amer: 52 mL/min — ABNORMAL LOW (ref >60)
GFR calc non Af Amer: 45 mL/min — ABNORMAL LOW (ref >60)
GLUCOSE: 108 mg/dL — AB (ref 65–99)
POTASSIUM: 4 mmol/L (ref 3.5–5.1)
Sodium: 138 mmol/L (ref 135–145)

## 2016-12-02 LAB — LIPID PANEL
Cholesterol: 195 mg/dL (ref 0–200)
HDL: 37 mg/dL — AB (ref >40)
LDL CALC: 129 mg/dL — AB (ref 0–99)
Total CHOL/HDL Ratio: 5.3 RATIO
Triglycerides: 147 mg/dL (ref <150)
VLDL: 29 mg/dL (ref 0–40)

## 2016-12-02 LAB — HEPATIC FUNCTION PANEL
ALK PHOS: 71 U/L (ref 38–126)
ALT: 24 U/L (ref 14–54)
AST: 23 U/L (ref 15–41)
Albumin: 4.6 g/dL (ref 3.5–5.0)
BILIRUBIN TOTAL: 0.5 mg/dL (ref 0.3–1.2)
Total Protein: 8.3 g/dL — ABNORMAL HIGH (ref 6.5–8.1)

## 2016-12-02 MED ORDER — TECHNETIUM TC 99M TETROFOSMIN IV KIT
30.0000 | PACK | Freq: Once | INTRAVENOUS | Status: AC | PRN
  Administered 2016-12-02: 30 via INTRAVENOUS

## 2016-12-02 MED ORDER — TECHNETIUM TC 99M TETROFOSMIN IV KIT
10.0000 | PACK | Freq: Once | INTRAVENOUS | Status: AC | PRN
  Administered 2016-12-02: 10 via INTRAVENOUS

## 2016-12-02 MED ORDER — REGADENOSON 0.4 MG/5ML IV SOLN
0.4000 mg | Freq: Once | INTRAVENOUS | Status: AC
  Administered 2016-12-02: 0.4 mg via INTRAVENOUS

## 2016-12-02 MED ORDER — REGADENOSON 0.4 MG/5ML IV SOLN
INTRAVENOUS | Status: AC
  Administered 2016-12-02: 0.4 mg via INTRAVENOUS
  Filled 2016-12-02: qty 5

## 2016-12-03 LAB — HEMOGLOBIN A1C
Hgb A1c MFr Bld: 5.9 % — ABNORMAL HIGH (ref 4.8–5.6)
Mean Plasma Glucose: 123 mg/dL

## 2017-01-18 ENCOUNTER — Emergency Department (HOSPITAL_COMMUNITY)
Admission: EM | Admit: 2017-01-18 | Discharge: 2017-01-18 | Disposition: A | Discharge status: Home / Self Care | Payer: BC Managed Care – PPO

## 2017-01-18 NOTE — ED Triage Notes (Signed)
Pt not found in lobby x 3

## 2017-01-21 ENCOUNTER — Other Ambulatory Visit: Payer: Self-pay | Admitting: Internal Medicine

## 2017-01-21 ENCOUNTER — Ambulatory Visit
Admission: RE | Admit: 2017-01-21 | Discharge: 2017-01-21 | Disposition: A | Discharge status: Home / Self Care | Payer: BC Managed Care – PPO | Source: Ambulatory Visit | Attending: Internal Medicine | Admitting: Internal Medicine

## 2017-01-21 DIAGNOSIS — M25521 Pain in right elbow: Secondary | ICD-10-CM

## 2017-03-07 ENCOUNTER — Other Ambulatory Visit (HOSPITAL_BASED_OUTPATIENT_CLINIC_OR_DEPARTMENT_OTHER): Payer: Self-pay

## 2017-03-07 DIAGNOSIS — G4730 Sleep apnea, unspecified: Secondary | ICD-10-CM

## 2017-03-16 ENCOUNTER — Ambulatory Visit (HOSPITAL_BASED_OUTPATIENT_CLINIC_OR_DEPARTMENT_OTHER): Payer: BC Managed Care – PPO | Attending: Internal Medicine | Admitting: Internal Medicine

## 2017-03-16 DIAGNOSIS — R0681 Apnea, not elsewhere classified: Secondary | ICD-10-CM | POA: Diagnosis present

## 2017-03-16 DIAGNOSIS — G4730 Sleep apnea, unspecified: Secondary | ICD-10-CM

## 2017-03-22 DIAGNOSIS — G4730 Sleep apnea, unspecified: Secondary | ICD-10-CM

## 2017-03-22 NOTE — Procedures (Signed)
  Patient Name: Kristin Lozano, Kristin Lozano Study Date: 03/16/2017 Gender: Female D.O.B: [birth year] Age (years): 51 Referring Provider: Willey Blade Height (inches): 69 Interpreting Physician: Jetty Duhamel MD, ABSM Weight (lbs): 220 RPSGT: Wylie Hail BMI: 32 MRN: [id number] Neck Size: 15.50 CLINICAL INFORMATION Sleep Study Type: NPSG  Indication for sleep study: Witnessed Apneas  Epworth Sleepiness Score: 22  SLEEP STUDY TECHNIQUE As per the AASM Manual for the Scoring of Sleep and Associated Events v2.3 (April 2016) with a hypopnea requiring 4% desaturations.  The channels recorded and monitored were frontal, central and occipital EEG, electrooculogram (EOG), submentalis EMG (chin), nasal and oral airflow, thoracic and abdominal wall motion, anterior tibialis EMG, snore microphone, electrocardiogram, and pulse oximetry.  MEDICATIONS Medications self-administered by patient taken the night of the study : BUPROPION  SLEEP ARCHITECTURE The study was initiated at 10:45:21 PM and ended at 4:48:45 AM.  Sleep onset time was 41.7 minutes and the sleep efficiency was 70.6%. The total sleep time was 256.7 minutes.  Stage REM latency was 298.5 minutes.  The patient spent 12.74% of the night in stage N1 sleep, 76.93% in stage N2 sleep, 1.75% in stage N3 and 8.57% in REM.  Alpha intrusion was absent.  Supine sleep was 13.72%.  RESPIRATORY PARAMETERS The overall apnea/hypopnea index (AHI) was 1.9 per hour. There were 0 total apneas, including 0 obstructive, 0 central and 0 mixed apneas. There were 8 hypopneas and 2 RERAs.  The AHI during Stage REM sleep was 19.1 per hour.  AHI while supine was 11.9 per hour.  The mean oxygen saturation was 94.68%. The minimum SpO2 during sleep was 89.00%.  moderate snoring was noted during this study.  CARDIAC DATA The 2 lead EKG demonstrated sinus rhythm. The mean heart rate was 65.54 beats per minute. Other EKG findings include: None.  LEG  MOVEMENT DATA The total PLMS were 0 with a resulting PLMS index of 0.00. Associated arousal with leg movement index was 0.0 .  IMPRESSIONS - No significant obstructive sleep apnea occurred during this study (AHI = 1.9/h). - No significant central sleep apnea occurred during this study (CAI = 0.0/h). - Mild oxygen desaturation was noted during this study (Min O2 = 89.00%). - The patient snored with moderate snoring volume. - No cardiac abnormalities were noted during this study. - Clinically significant periodic limb movements did not occur during sleep. No significant associated arousals.  DIAGNOSIS - Primary Snoring (786.09 [R06.83 ICD-10])  RECOMMENDATIONS - Positional therapy avoiding supine position during sleep. - Be careful with alcohol, sedatives and other CNS depressants that may worsen sleep apnea and disrupt normal sleep architecture. - Sleep hygiene should be reviewed to assess factors that may improve sleep quality. - Weight management and regular exercise should be initiated or continued if appropriate.  [Electronically signed] 03/22/2017 11:55 AM  Jetty Duhamel MD, ABSM Diplomate, American Board of Sleep Medicine   NPI: [id number]  Waymon Budge Diplomate, American Board of Sleep Medicine  ELECTRONICALLY SIGNED ON:  03/22/2017, 11:50 AM [facility] PH: [phone]   FX: [phone] ACCREDITED BY THE AMERICAN ACADEMY OF SLEEP MEDICINE

## 2018-07-12 ENCOUNTER — Other Ambulatory Visit: Payer: Self-pay

## 2018-07-12 ENCOUNTER — Emergency Department (HOSPITAL_COMMUNITY)
Admission: EM | Admit: 2018-07-12 | Discharge: 2018-07-12 | Disposition: A | Discharge status: Home / Self Care | Payer: BC Managed Care – PPO | Attending: Emergency Medicine | Admitting: Emergency Medicine

## 2018-07-12 ENCOUNTER — Encounter (HOSPITAL_COMMUNITY): Payer: Self-pay

## 2018-07-12 DIAGNOSIS — I10 Essential (primary) hypertension: Secondary | ICD-10-CM | POA: Insufficient documentation

## 2018-07-12 DIAGNOSIS — M79604 Pain in right leg: Secondary | ICD-10-CM | POA: Insufficient documentation

## 2018-07-12 DIAGNOSIS — M25561 Pain in right knee: Secondary | ICD-10-CM | POA: Diagnosis present

## 2018-07-12 DIAGNOSIS — J45909 Unspecified asthma, uncomplicated: Secondary | ICD-10-CM | POA: Diagnosis not present

## 2018-07-12 DIAGNOSIS — Z79899 Other long term (current) drug therapy: Secondary | ICD-10-CM | POA: Insufficient documentation

## 2018-07-12 DIAGNOSIS — R52 Pain, unspecified: Secondary | ICD-10-CM

## 2018-07-12 MED ORDER — IBUPROFEN 600 MG PO TABS: 600.0000 mg | ORAL_TABLET | Freq: Three times a day (TID) | ORAL | 0 refills | Status: AC | PRN

## 2018-07-12 NOTE — Discharge Instructions (Addendum)
You were seen in the emergency department for 1 week of right knee radiating into your right calf pain with some swelling.  This is likely something related to the joint although we are setting you up for an ultrasound to make sure there is no signs of blood clot.  You should use ibuprofen 3 times a day as needed, take it with some food.  You can use ice or heat to the affected area.  Please contact your primary care doctor as they may want to refer you onto orthopedics or physical therapy.  Please return if any acute worsening in chest pain or shortness of breath.

## 2018-07-12 NOTE — ED Notes (Signed)
Bed: WT88 Expected date:  Expected time:  Means of arrival:  Comments: [place]

## 2018-07-12 NOTE — ED Provider Notes (Signed)
[facility]-EMERGENCY DEPT Provider Note   CSN: [id number] Arrival date & time: 07/12/18  8916     History   Chief Complaint Chief Complaint  Patient presents with  . Leg Pain    R    HPI Kristin Lozano is a 53 y.o. female.  She is presenting with about a week's worth of right knee pain that radiates down her calf to her ankle.  There was no trauma.  She said she uses a treadmill and is been bothering her since then.  Said she is noticed a little bit of swelling at her ankle and she has been using support hose.  No fevers or chills no chest pain.  She feels vaguely some shortness of breath when she is ambulating.  She is tried some ice and some heat to it with minimal improvement.  No prior history of DVT or PE.  No prolonged immobilization or plane rides.  The history is provided by the patient.  Leg Pain  Location:  Knee, ankle and leg Leg location:  R lower leg Knee location:  R knee Ankle location:  R ankle Pain details:    Quality:  Sharp   Radiates to:  Does not radiate   Severity:  Moderate   Onset quality:  Gradual   Duration:  1 week   Timing:  Constant   Progression:  Unchanged Chronicity:  New Dislocation: no   Prior injury to area:  No Relieved by:  Nothing Worsened by:  Bearing weight and activity Ineffective treatments:  Ice and heat Associated symptoms: no back pain, no decreased ROM, no fever, no muscle weakness, no numbness and no tingling     Past Medical History:  Diagnosis Date  . Allergy   . Asthma   . Depression   . Essential hypertension   . Migraine   . Obesity   . Osteoarthrosis, unspecified whether generalized or localized, unspecified site   . Situational stress   . Vitamin D deficiency     Patient Active Problem List   Diagnosis Date Noted  . Acute asthma exacerbation 11/16/2015  . Asthma attack   . Essential hypertension   . Obesity   . Vitamin D deficiency   . Allergy     Past Surgical History:   Procedure Laterality Date  . ablasion    . MOUTH SURGERY       OB History   No obstetric history on file.      Home Medications    Prior to Admission medications   Medication Sig Start Date End Date Taking? Authorizing Provider  albuterol (PROVENTIL) (5 MG/ML) 0.5% nebulizer solution Take 0.5 mLs (2.5 mg total) by nebulization every 6 (six) hours as needed for wheezing or shortness of breath. 11/16/15   Lavera Guise, MD  amLODipine (NORVASC) 10 MG tablet Take 1 tablet (10 mg total) by mouth daily. 11/16/15   Lavera Guise, MD  azithromycin (ZITHROMAX) 250 MG tablet Take 1 tablet (250 mg total) by mouth daily. Take first 2 tablets together, then 1 every day until finished. 11/16/15   Lavera Guise, MD  benzonatate (TESSALON) 100 MG capsule Take 1 capsule (100 mg total) by mouth every 8 (eight) hours. 11/16/15   Fayrene Helper, PA-C  citalopram (CELEXA) 40 MG tablet Take 40 mg by mouth daily.    [provider]  Fluticasone-Salmeterol (ADVAIR DISKUS) 500-50 MCG/DOSE AEPB Inhale 1 puff into the lungs 2 (two) times daily. 11/16/15   Lavera Guise,  MD  ibuprofen (ADVIL,MOTRIN) 200 MG tablet Take 400 mg by mouth every 6 (six) hours as needed for moderate pain.    [provider]  loratadine (CLARITIN) 10 MG tablet Take 1 tablet (10 mg total) by mouth daily. 11/16/15   Lavera GuiseLiu, Dana Duo, MD  metoprolol succinate (TOPROL-XL) 50 MG 24 hr tablet Take 50 mg by mouth 2 (two) times daily. Take with or immediately following a meal.    [provider]  predniSONE (DELTASONE) 10 MG tablet Take 5 tablets (50 mg total) by mouth daily. 11/16/15   Lavera GuiseLiu, Dana Duo, MD  promethazine-dextromethorphan (PROMETHAZINE-DM) 6.25-15 MG/5ML syrup Take 5 mLs by mouth 4 (four) times daily as needed for cough. 11/16/15   Fayrene Helperran, Bowie, PA-C  spironolactone (ALDACTONE) 25 MG tablet Take 25 mg by mouth 2 (two) times daily. 11/08/15   [provider]  terconazole (TERAZOL 7) 0.4 % vaginal cream Place 1 applicator  vaginally daily. For 7 days 11/14/15   [provider]    Family History Family History  Problem Relation Age of Onset  . Arthritis Mother   . Hypertension Mother   . Diabetes Father     Social History Social History   Tobacco Use  . Smoking status: Never Smoker  . Smokeless tobacco: Never Used  Substance Use Topics  . Alcohol use: No  . Drug use: No     Allergies   Clindamycin/lincomycin and Sulfa antibiotics   Review of Systems Review of Systems  Constitutional: Negative for chills and fever.  HENT: Negative for sore throat.   Eyes: Negative for pain and visual disturbance.  Respiratory: Negative for cough.   Cardiovascular: Negative for chest pain.  Gastrointestinal: Negative for abdominal pain.  Genitourinary: Negative for dysuria.  Musculoskeletal: Negative for back pain.  Skin: Negative for rash.  Neurological: Negative for syncope.  All other systems reviewed and are negative.    Physical Exam Updated Vital Signs BP 128/80 (BP Location: Left Arm)   Pulse 77   Temp 98.2 F (36.8 C) (Oral)   Resp 20   SpO2 98%   Physical Exam Vitals signs and nursing note reviewed.  Constitutional:      General: She is not in acute distress.    Appearance: She is well-developed.  HENT:     Head: Normocephalic and atraumatic.  Eyes:     Conjunctiva/sclera: Conjunctivae normal.  Neck:     Musculoskeletal: Neck supple.  Cardiovascular:     Rate and Rhythm: Normal rate and regular rhythm.     Heart sounds: No murmur.  Pulmonary:     Effort: Pulmonary effort is normal. No respiratory distress.     Breath sounds: Normal breath sounds.  Abdominal:     Palpations: Abdomen is soft.     Tenderness: There is no abdominal tenderness.  Musculoskeletal: Normal range of motion.        General: Tenderness present. No deformity or signs of injury.     Comments: She has no right hip tenderness.  There is a little bit of tenderness at both of her joint lines of her  knee.  No significant effusion or warmth.  She has no cords in her calf but she does have some tenderness to palpation.  Achilles is intact.  May be trace edema.  DP and PT pulses intact.  Sensation to light touch intact.  Skin:    General: Skin is warm and dry.  Neurological:     General: No focal deficit present.  Mental Status: She is alert and oriented to person, place, and time.     Sensory: No sensory deficit.     Motor: No weakness.      ED Treatments / Results  Labs (all labs ordered are listed, but only abnormal results are displayed) Labs Reviewed - No data to display  EKG None  Radiology No results found.  Procedures Procedures (including critical care time)  Medications Ordered in ED Medications - No data to display   Initial Impression / Assessment and Plan / ED Course  I have reviewed the triage vital signs and the nursing notes.  Pertinent labs & imaging results that were available during my care of the patient were reviewed by me and considered in my medical decision making (see chart for details).  Clinical Course as of Jul 12 2308  Wynelle Link Jul 12, 2018  2134 Patient here with 1 week of right knee through calf pain.  Differential includes DVT, Baker's cyst, arthritis or meniscal.  I recommended an ultrasound for her which unfortunately will have to be done tomorrow as the techs of left for the day.  We will put her on some NSAIDs.  She has a PCP so she understands that the duplex tomorrow is negative she needs to talk to her PCP about possible referral to orthopedics or physical therapy.   [MB]    Clinical Course User Index [MB] Terrilee Files, MD      Final Clinical Impressions(s) / ED Diagnoses   Final diagnoses:  Acute pain of right knee  Right leg pain    ED Discharge Orders         Ordered    ibuprofen (ADVIL,MOTRIN) 600 MG tablet  Every 8 hours PRN     07/12/18 2147    UE VENOUS DUPLEX     07/12/18 2148           Terrilee Files, MD 07/12/18 2311

## 2018-07-12 NOTE — ED Triage Notes (Addendum)
Pt reports R leg pain from her thigh down to her ankle. She denies any recent long trips. Denies any swelling, redness, or heat from affected area. She states that her ankle were swelling earlier this week so she started wearing compression socks. A&Ox4. Ambulatory.

## 2018-07-13 ENCOUNTER — Ambulatory Visit (HOSPITAL_COMMUNITY)
Admission: RE | Admit: 2018-07-13 | Discharge: 2018-07-13 | Disposition: A | Discharge status: Home / Self Care | Payer: BC Managed Care – PPO | Source: Ambulatory Visit | Attending: Emergency Medicine | Admitting: Emergency Medicine

## 2018-07-13 DIAGNOSIS — R52 Pain, unspecified: Secondary | ICD-10-CM

## 2018-07-13 NOTE — Progress Notes (Signed)
VASCULAR LAB PRELIMINARY  PRELIMINARY  PRELIMINARY  PRELIMINARY  Right lower extremity venous duplex completed.    Preliminary report:  See CV Proc   Sherren Kerns, RVT 07/13/2018, 9:46 AM

## 2018-07-22 ENCOUNTER — Other Ambulatory Visit: Payer: Self-pay | Admitting: Internal Medicine

## 2018-07-22 ENCOUNTER — Ambulatory Visit
Admission: RE | Admit: 2018-07-22 | Discharge: 2018-07-22 | Disposition: A | Discharge status: Home / Self Care | Payer: BC Managed Care – PPO | Source: Ambulatory Visit | Attending: Internal Medicine | Admitting: Internal Medicine

## 2018-07-22 DIAGNOSIS — M25561 Pain in right knee: Principal | ICD-10-CM

## 2018-07-22 DIAGNOSIS — M25461 Effusion, right knee: Secondary | ICD-10-CM

## 2018-12-24 ENCOUNTER — Ambulatory Visit
Admission: RE | Admit: 2018-12-24 | Discharge: 2018-12-24 | Disposition: A | Discharge status: Home / Self Care | Payer: BC Managed Care – PPO | Source: Ambulatory Visit | Attending: Internal Medicine | Admitting: Internal Medicine

## 2018-12-24 ENCOUNTER — Other Ambulatory Visit: Payer: Self-pay | Admitting: Internal Medicine

## 2018-12-24 DIAGNOSIS — T1490XA Injury, unspecified, initial encounter: Secondary | ICD-10-CM

## 2019-02-06 ENCOUNTER — Other Ambulatory Visit: Payer: Self-pay

## 2019-02-06 DIAGNOSIS — Z20822 Contact with and (suspected) exposure to covid-19: Secondary | ICD-10-CM

## 2019-02-07 LAB — NOVEL CORONAVIRUS, NAA: SARS-CoV-2, NAA: NOT DETECTED

## 2019-03-24 ENCOUNTER — Other Ambulatory Visit: Payer: Self-pay | Admitting: Cardiology

## 2019-03-24 ENCOUNTER — Other Ambulatory Visit (HOSPITAL_COMMUNITY): Payer: Self-pay | Admitting: Cardiology

## 2019-03-24 DIAGNOSIS — R079 Chest pain, unspecified: Secondary | ICD-10-CM

## 2019-03-29 ENCOUNTER — Other Ambulatory Visit: Payer: Self-pay

## 2019-03-29 ENCOUNTER — Encounter (HOSPITAL_COMMUNITY)
Admission: RE | Admit: 2019-03-29 | Discharge: 2019-03-29 | Disposition: A | Discharge status: Home / Self Care | Payer: BC Managed Care – PPO | Source: Ambulatory Visit | Attending: Cardiology | Admitting: Cardiology

## 2019-03-29 ENCOUNTER — Encounter (HOSPITAL_COMMUNITY): Payer: Self-pay

## 2019-03-29 DIAGNOSIS — R079 Chest pain, unspecified: Secondary | ICD-10-CM

## 2019-03-29 MED ORDER — REGADENOSON 0.4 MG/5ML IV SOLN
INTRAVENOUS | Status: AC
  Filled 2019-03-29: qty 5

## 2019-03-29 MED ORDER — REGADENOSON 0.4 MG/5ML IV SOLN
0.4000 mg | Freq: Once | INTRAVENOUS | Status: AC
  Administered 2019-03-29: 0.4 mg via INTRAVENOUS

## 2019-03-29 MED ORDER — TECHNETIUM TC 99M TETROFOSMIN IV KIT
10.4000 | PACK | Freq: Once | INTRAVENOUS | Status: AC | PRN
  Administered 2019-03-29: 09:00:00 10.4 via INTRAVENOUS

## 2019-03-29 MED ORDER — TECHNETIUM TC 99M TETROFOSMIN IV KIT
30.0000 | PACK | Freq: Once | INTRAVENOUS | Status: AC | PRN
  Administered 2019-03-29: 11:00:00 30 via INTRAVENOUS

## 2020-12-12 ENCOUNTER — Other Ambulatory Visit: Payer: Self-pay | Admitting: Internal Medicine

## 2020-12-12 ENCOUNTER — Other Ambulatory Visit: Payer: Self-pay

## 2020-12-12 ENCOUNTER — Ambulatory Visit
Admission: RE | Admit: 2020-12-12 | Discharge: 2020-12-12 | Disposition: A | Discharge status: Home / Self Care | Payer: BC Managed Care – PPO | Source: Ambulatory Visit | Attending: Internal Medicine | Admitting: Internal Medicine

## 2020-12-12 DIAGNOSIS — R309 Painful micturition, unspecified: Secondary | ICD-10-CM

## 2021-05-08 ENCOUNTER — Other Ambulatory Visit: Payer: Self-pay | Admitting: Internal Medicine

## 2021-05-08 ENCOUNTER — Ambulatory Visit
Admission: RE | Admit: 2021-05-08 | Discharge: 2021-05-08 | Disposition: A | Discharge status: Home / Self Care | Payer: BC Managed Care – PPO | Source: Ambulatory Visit | Attending: Internal Medicine | Admitting: Internal Medicine

## 2021-05-08 DIAGNOSIS — R0789 Other chest pain: Secondary | ICD-10-CM

## 2021-06-28 ENCOUNTER — Other Ambulatory Visit: Payer: Self-pay

## 2021-06-28 DIAGNOSIS — R0683 Snoring: Secondary | ICD-10-CM

## 2021-06-28 DIAGNOSIS — G4710 Hypersomnia, unspecified: Secondary | ICD-10-CM

## 2021-06-28 DIAGNOSIS — R5383 Other fatigue: Secondary | ICD-10-CM

## 2021-08-02 ENCOUNTER — Encounter (HOSPITAL_BASED_OUTPATIENT_CLINIC_OR_DEPARTMENT_OTHER): Payer: BC Managed Care – PPO | Admitting: Internal Medicine

## 2021-08-13 ENCOUNTER — Encounter (HOSPITAL_BASED_OUTPATIENT_CLINIC_OR_DEPARTMENT_OTHER): Payer: BC Managed Care – PPO | Admitting: Internal Medicine

## 2021-08-20 ENCOUNTER — Other Ambulatory Visit: Payer: Self-pay | Admitting: Internal Medicine

## 2021-08-20 DIAGNOSIS — N289 Disorder of kidney and ureter, unspecified: Secondary | ICD-10-CM

## 2021-08-21 ENCOUNTER — Ambulatory Visit
Admission: RE | Admit: 2021-08-21 | Discharge: 2021-08-21 | Disposition: A | Discharge status: Home / Self Care | Payer: BC Managed Care – PPO | Source: Ambulatory Visit | Attending: Internal Medicine | Admitting: Internal Medicine

## 2021-08-21 ENCOUNTER — Other Ambulatory Visit: Payer: Self-pay

## 2021-08-21 DIAGNOSIS — N289 Disorder of kidney and ureter, unspecified: Secondary | ICD-10-CM

## 2022-01-15 ENCOUNTER — Other Ambulatory Visit: Payer: Self-pay | Admitting: Internal Medicine

## 2022-01-15 ENCOUNTER — Ambulatory Visit
Admission: RE | Admit: 2022-01-15 | Discharge: 2022-01-15 | Disposition: A | Discharge status: Home / Self Care | Payer: BC Managed Care – PPO | Source: Ambulatory Visit | Attending: Internal Medicine | Admitting: Internal Medicine

## 2022-01-15 DIAGNOSIS — R079 Chest pain, unspecified: Secondary | ICD-10-CM

## 2023-04-11 IMAGING — DX DG CHEST 2V
2 series · 2 of 2 positions shown · non-contrast
Comparison: CTA/X-ray chest 11/16/2015.

CLINICAL DATA: Anterior chest wall pain under right breast for 1
month.

EXAM:
CHEST - 2 VIEW

[dg chest 2 view (1 of 2)]
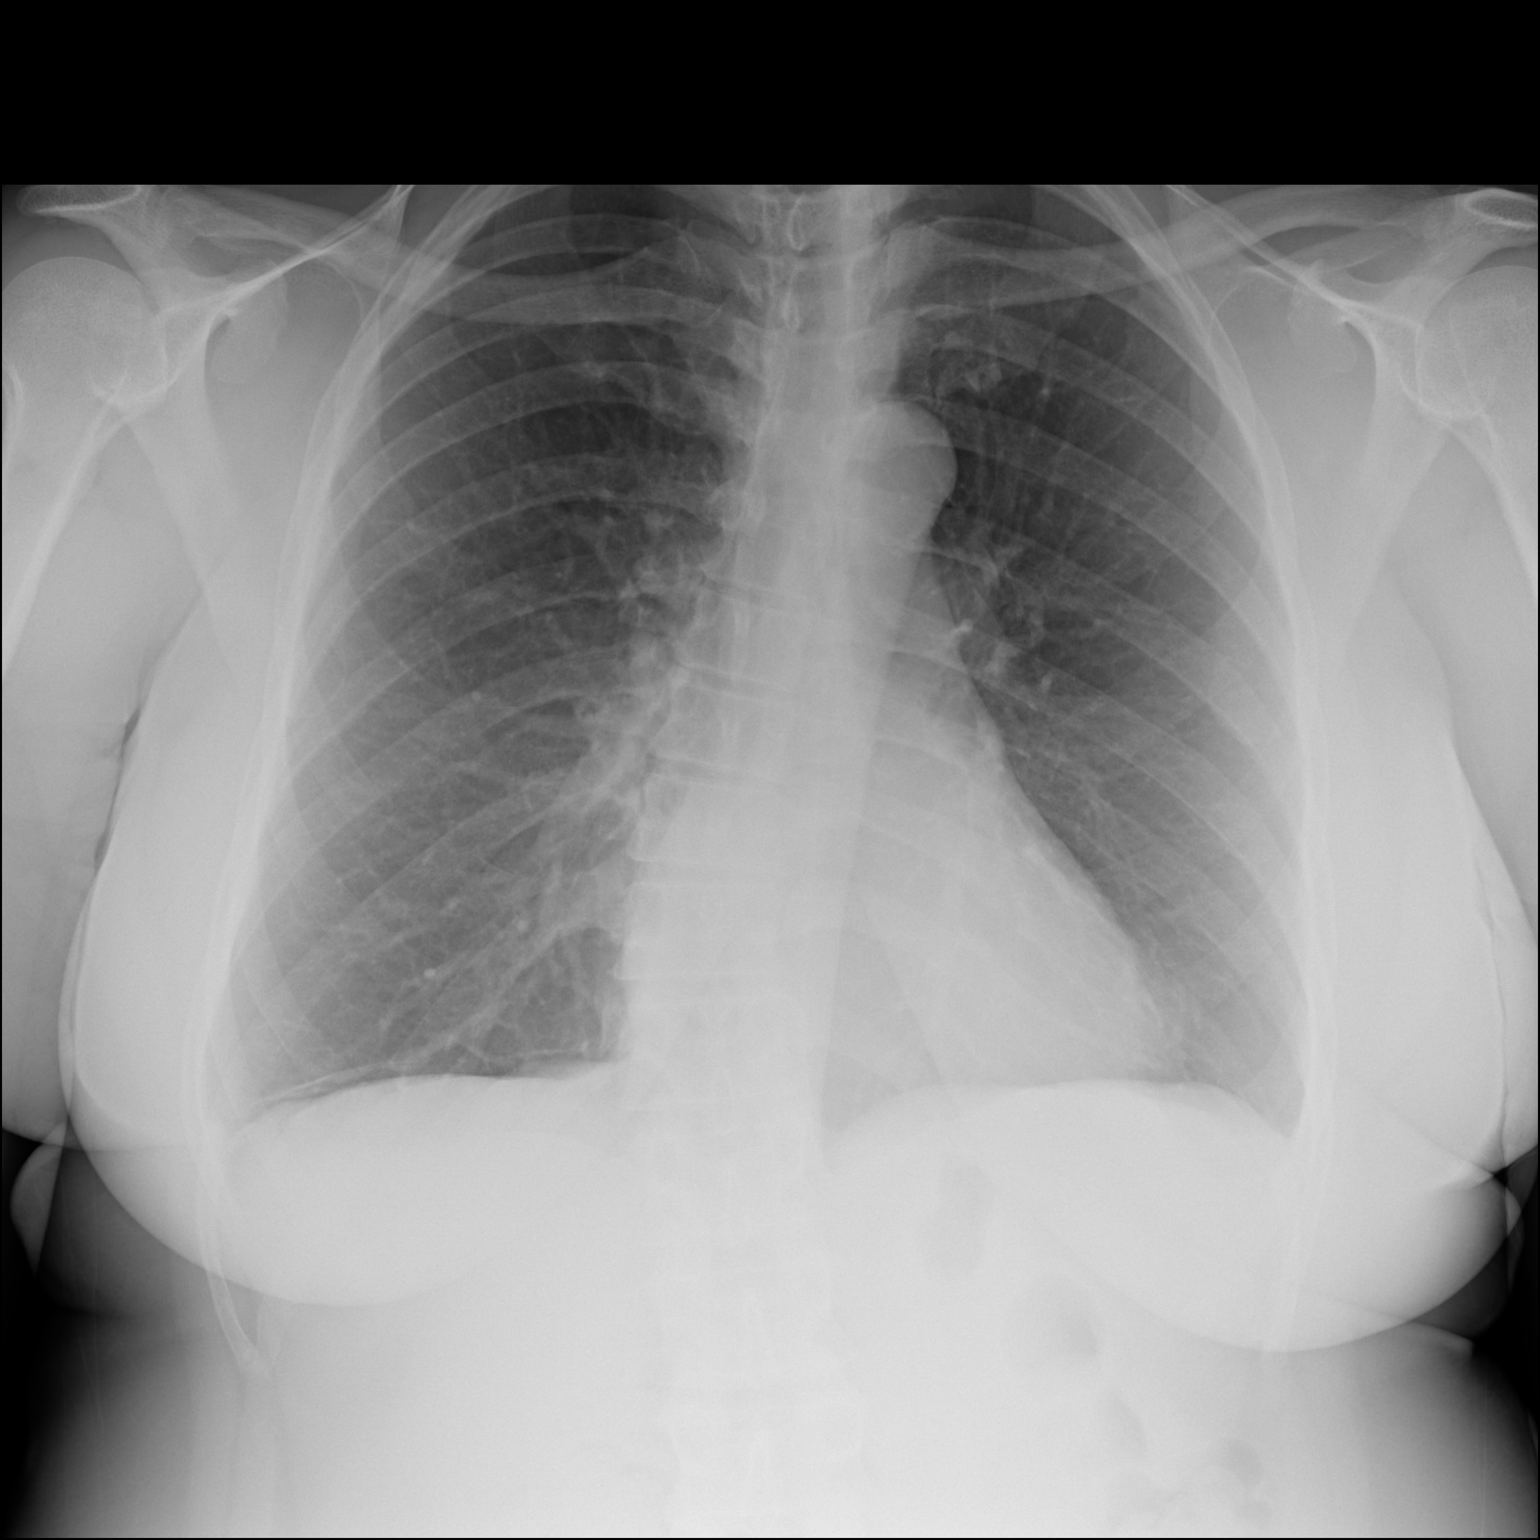

[dg chest 2 view (2 of 2)]
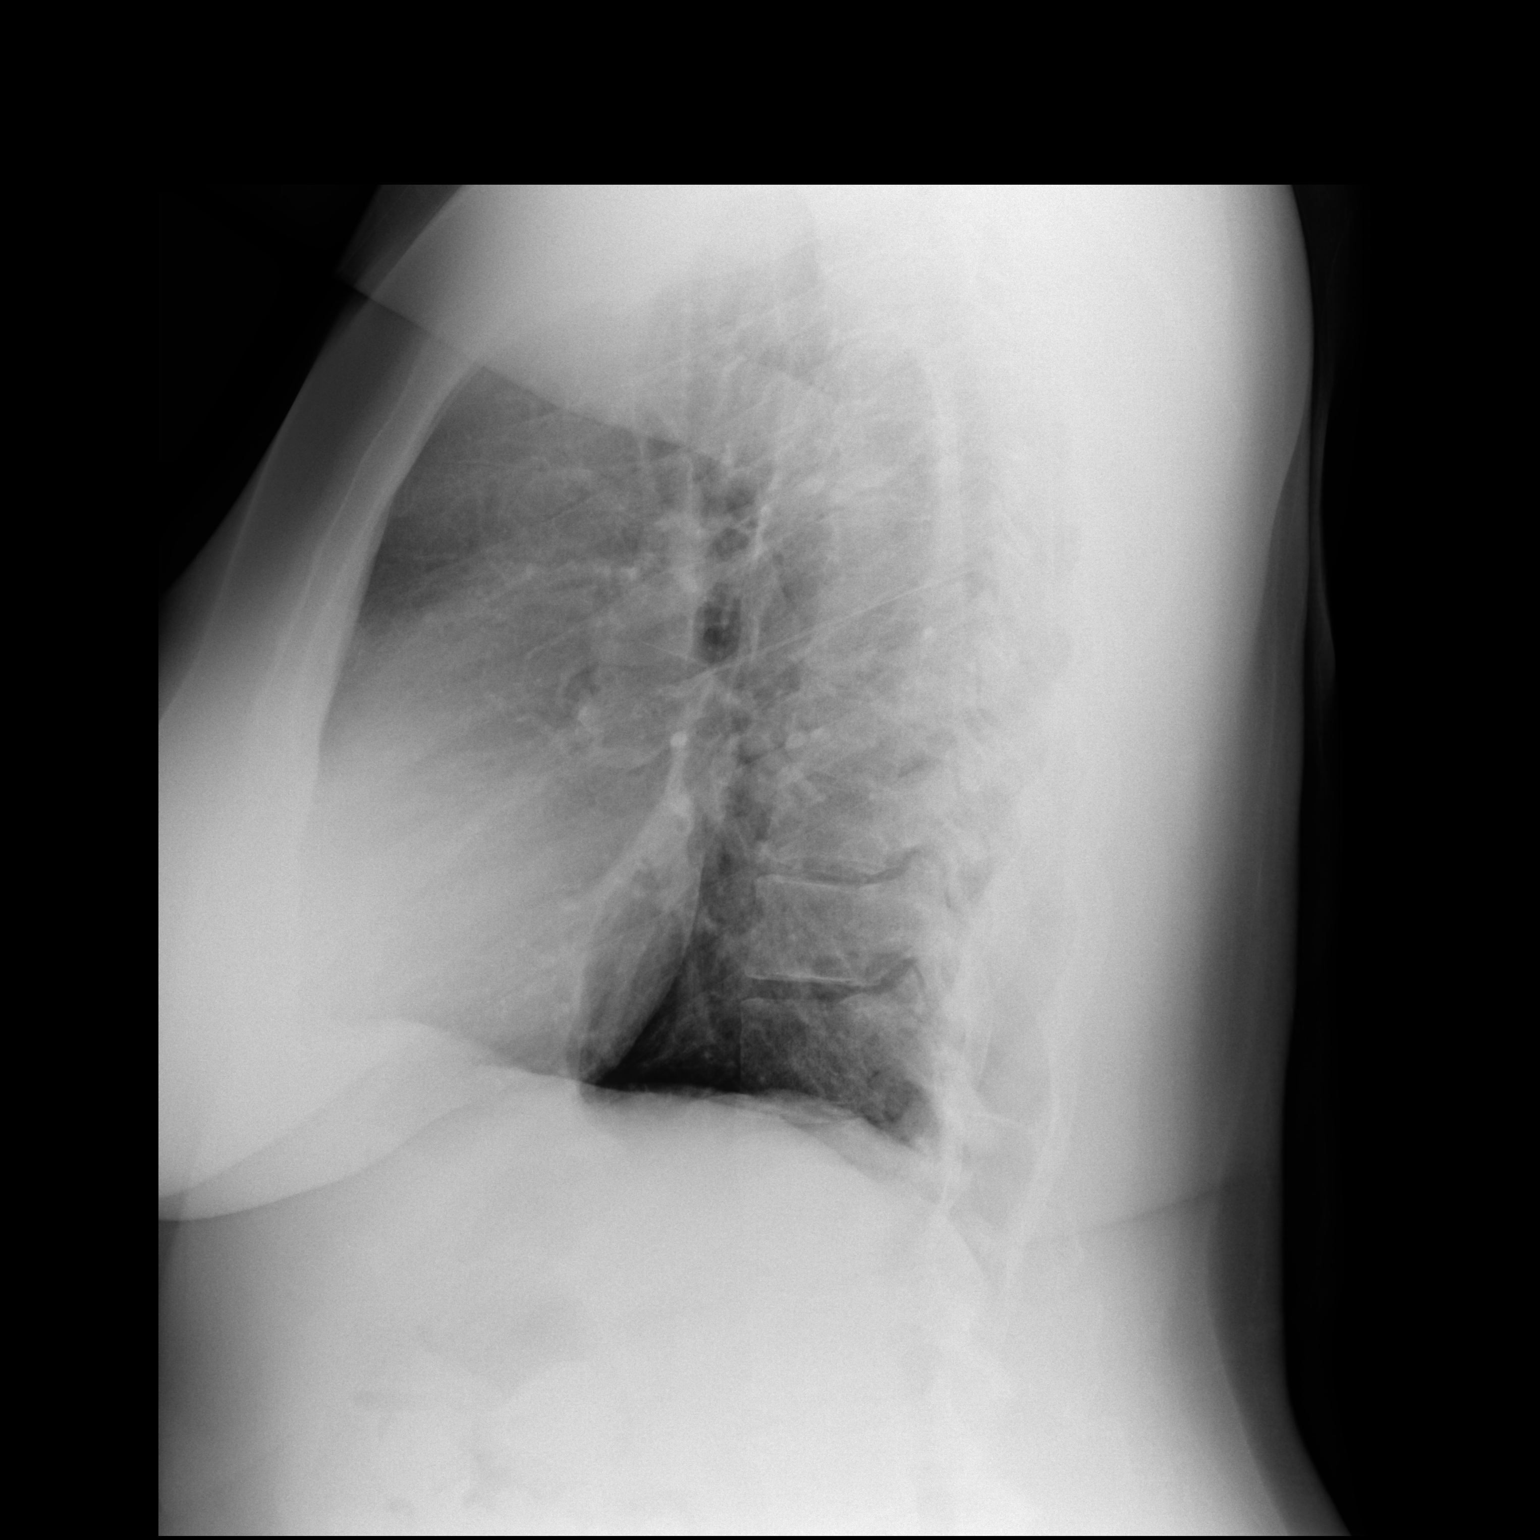

[2 of 2 positions shown; findings below may reference images not displayed]

FINDINGS: The heart size and mediastinal contours are within normal limits.
Linear scarring or atelectasis at the right lung base. The
visualized skeletal structures are unremarkable.
IMPRESSION: Linear right basilar atelectasis or scarring.

## 2023-07-25 IMAGING — US US RENAL
1 series · 14 of 25 positions shown · non-contrast
Comparison: None available.

CLINICAL DATA: Initial evaluation for decreased renal function.

EXAM:
RENAL / URINARY TRACT ULTRASOUND COMPLETE

[Series 1: us renal · 0.23mm/px · 14 of 37 slices shown]
[im 1/37]
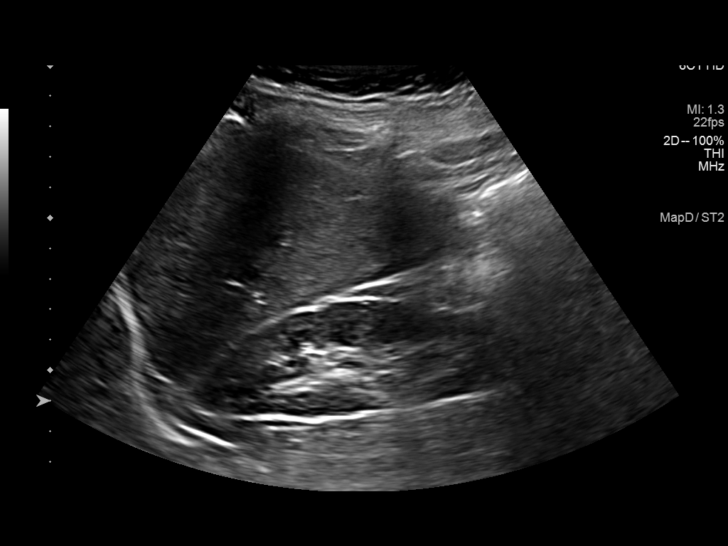
[im 4/37]
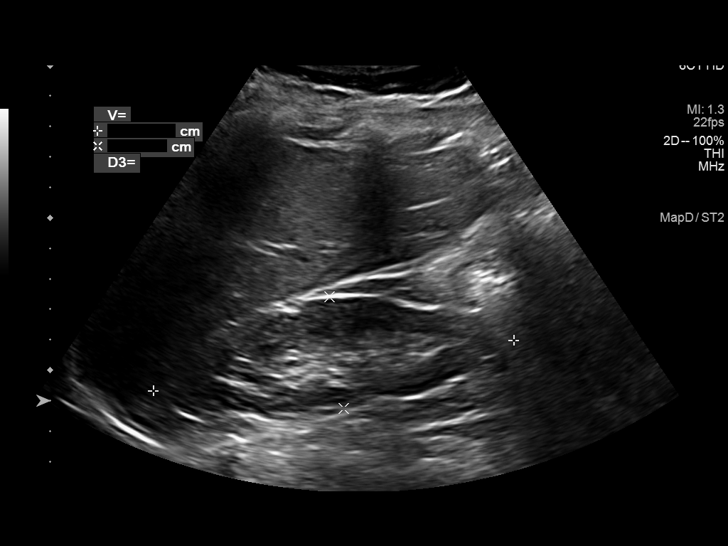
[im 7/37]
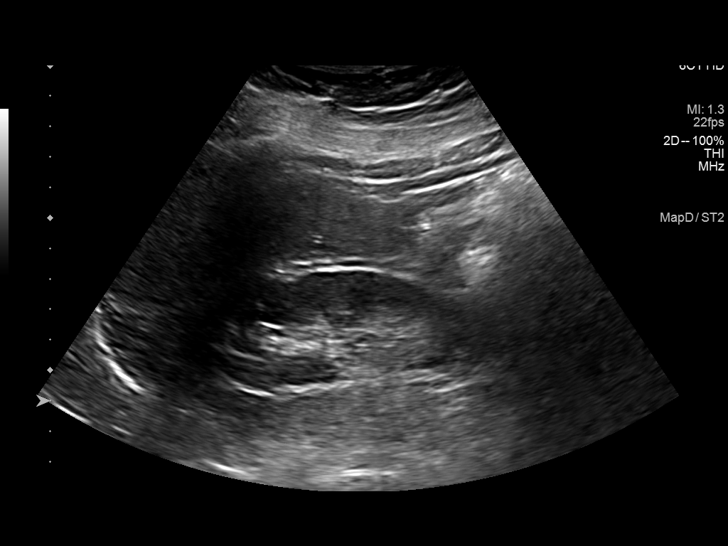
[im 10/37]
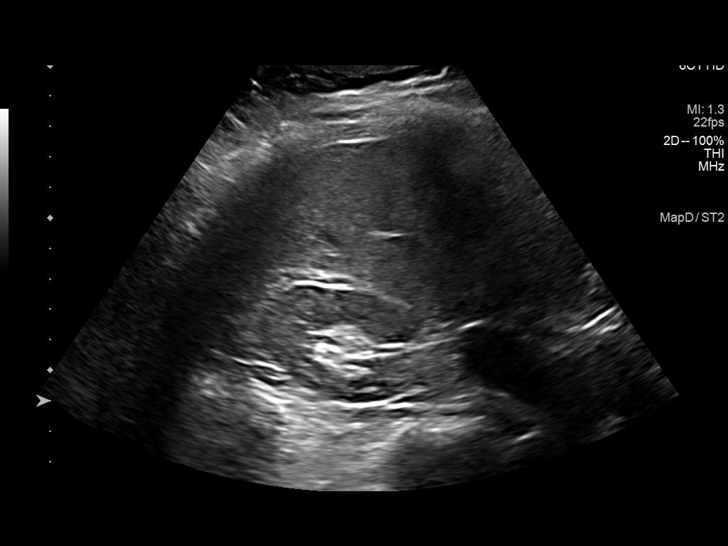
[im 13/37]
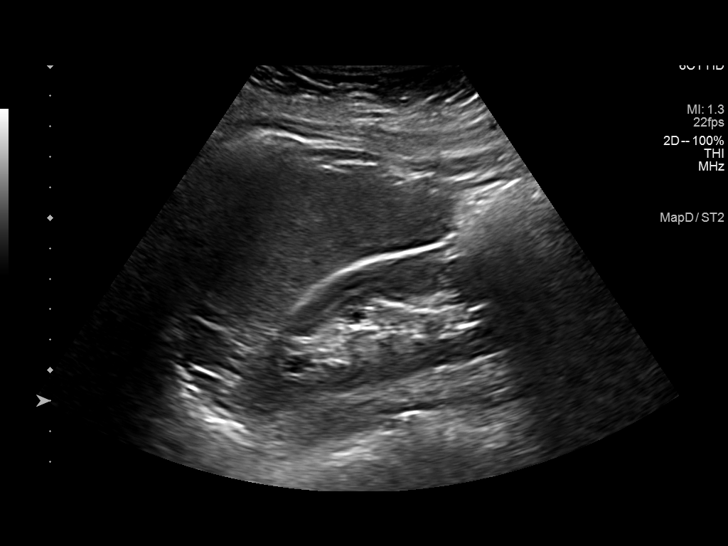
[im 14/37]
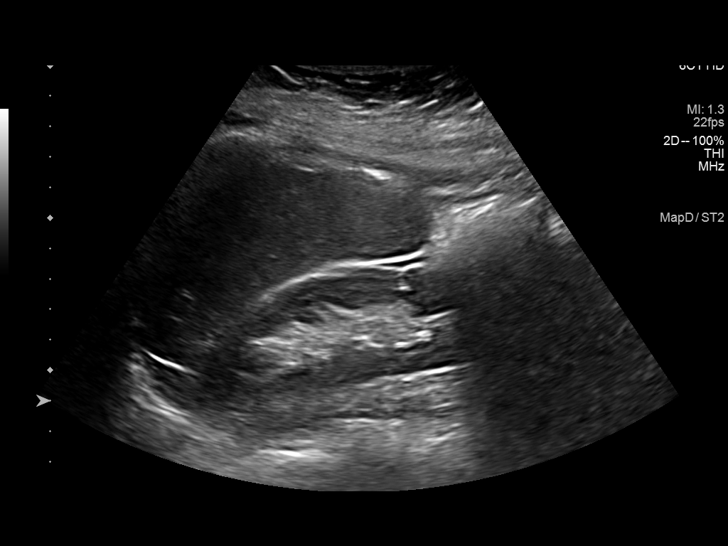
[im 17/37]
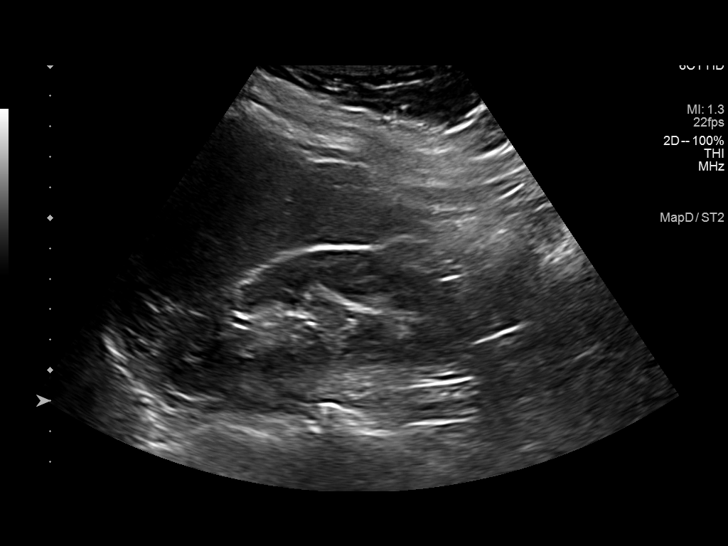
[im 20/37]
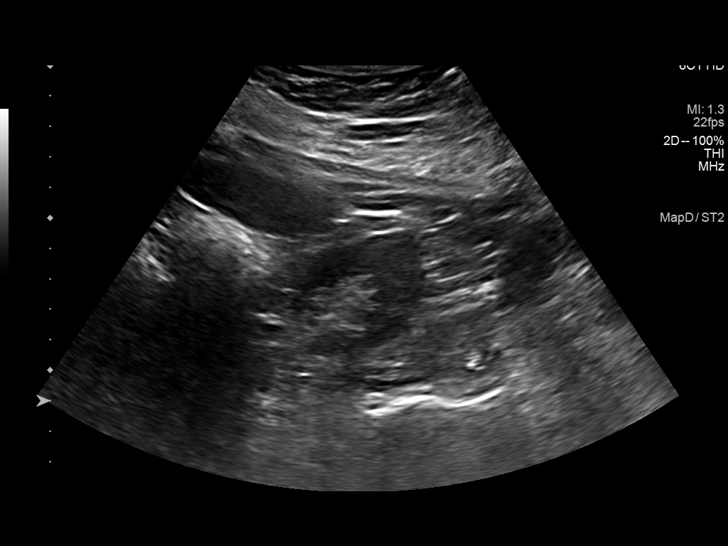
[im 23/37]
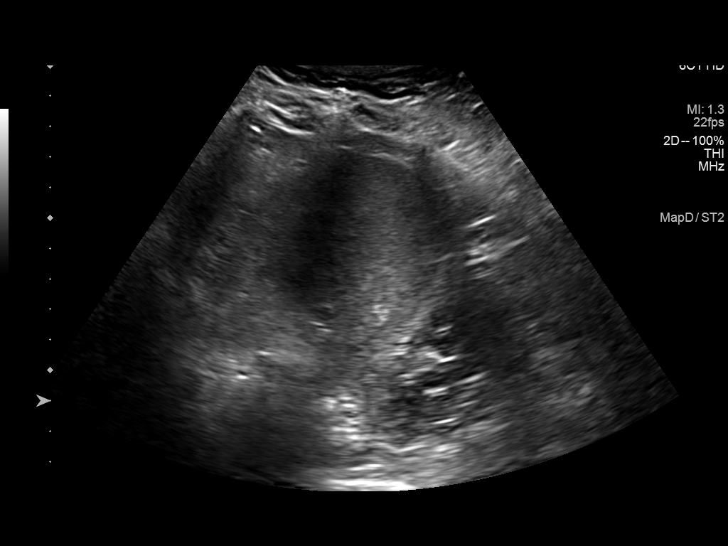
[im 25/37]
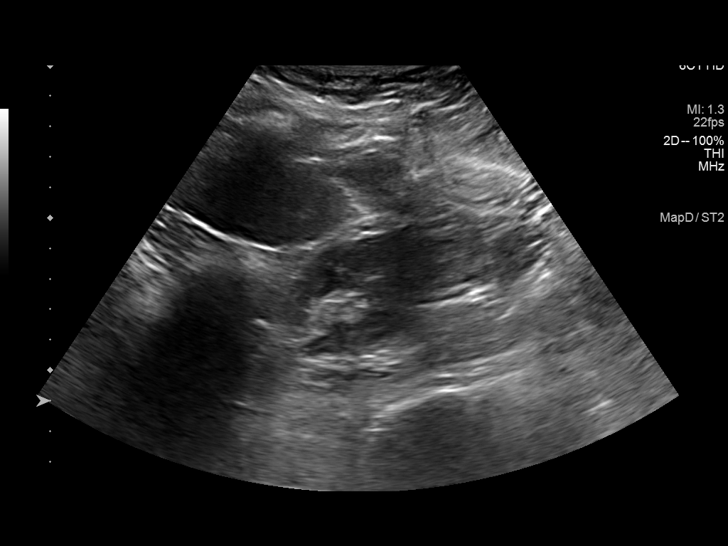
[im 28/37]
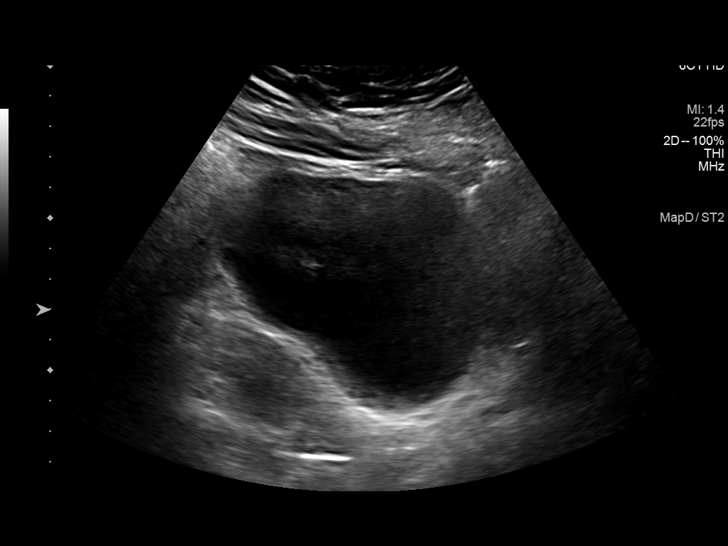
[im 31/37]
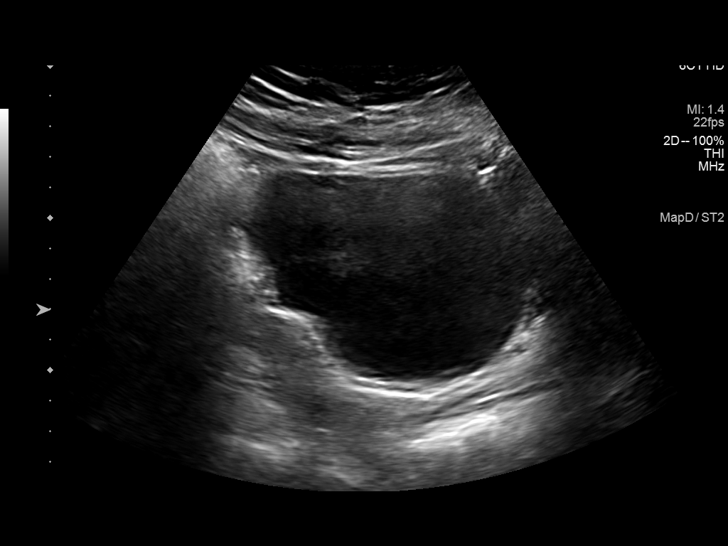
[im 34/37]
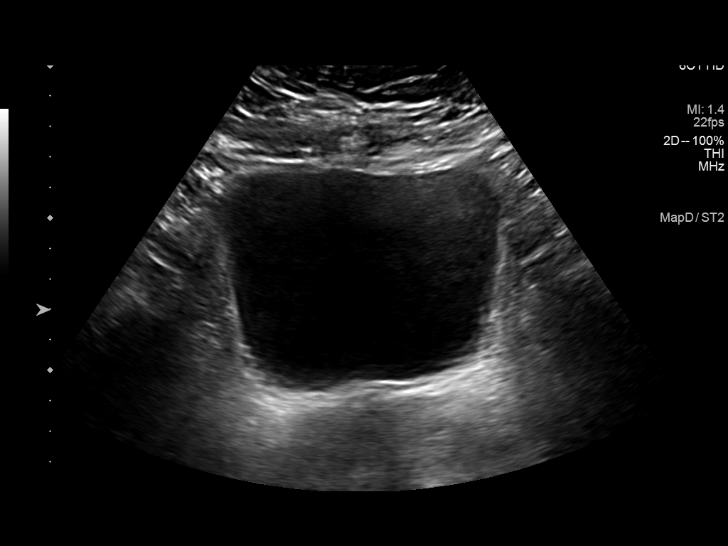
[im 37/37]
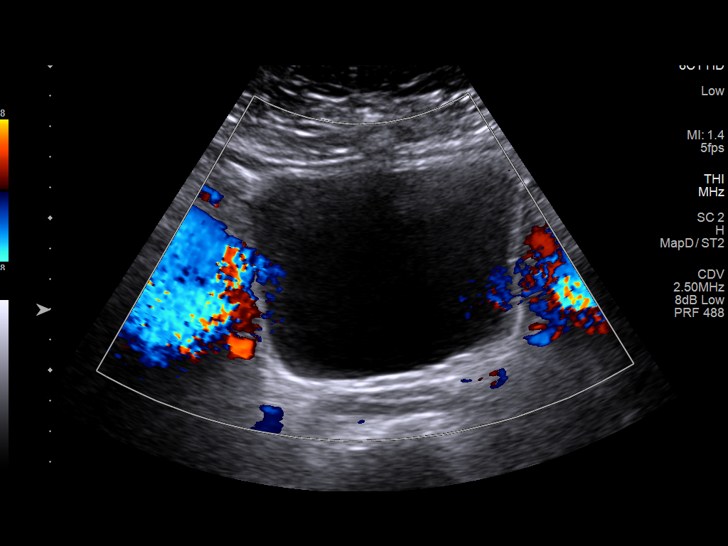

[14 of 25 positions shown; findings below may reference images not displayed]

FINDINGS: Right Kidney:

Renal measurements: 12.0 x 3.7 x 4.0 cm = volume: 91.4 mL. Renal
echogenicity within normal limits. No nephrolithiasis or
hydronephrosis. No focal renal mass.

Left Kidney:

Renal measurements: 10.5 x 3.9 x 3.6 cm = volume: 77.4 mL. Renal
echogenicity within normal limits. No nephrolithiasis or
hydronephrosis. No focal renal mass.

Bladder:

Appears normal for degree of bladder distention.

Other:

None.
IMPRESSION: Normal renal ultrasound. No hydronephrosis or other significant
finding.

## 2023-10-08 ENCOUNTER — Emergency Department (HOSPITAL_BASED_OUTPATIENT_CLINIC_OR_DEPARTMENT_OTHER)
Admission: EM | Admit: 2023-10-08 | Discharge: 2023-10-08 | Disposition: A | Discharge status: Home / Self Care | Attending: Emergency Medicine | Admitting: Emergency Medicine

## 2023-10-08 ENCOUNTER — Encounter (HOSPITAL_BASED_OUTPATIENT_CLINIC_OR_DEPARTMENT_OTHER): Payer: Self-pay | Admitting: Emergency Medicine

## 2023-10-08 ENCOUNTER — Other Ambulatory Visit: Payer: Self-pay

## 2023-10-08 DIAGNOSIS — R197 Diarrhea, unspecified: Secondary | ICD-10-CM | POA: Insufficient documentation

## 2023-10-08 DIAGNOSIS — R112 Nausea with vomiting, unspecified: Secondary | ICD-10-CM | POA: Insufficient documentation

## 2023-10-08 DIAGNOSIS — Z20822 Contact with and (suspected) exposure to covid-19: Secondary | ICD-10-CM | POA: Insufficient documentation

## 2023-10-08 DIAGNOSIS — R195 Other fecal abnormalities: Secondary | ICD-10-CM

## 2023-10-08 LAB — RESP PANEL BY RT-PCR (RSV, FLU A&B, COVID)  RVPGX2
Influenza A by PCR: NEGATIVE
Influenza B by PCR: NEGATIVE
Resp Syncytial Virus by PCR: NEGATIVE
SARS Coronavirus 2 by RT PCR: NEGATIVE

## 2023-10-08 MED ORDER — ONDANSETRON 4 MG PO TBDP
8.0000 mg | ORAL_TABLET | Freq: Once | ORAL | Status: AC
  Administered 2023-10-08: 8 mg via ORAL
  Filled 2023-10-08: qty 2

## 2023-10-08 MED ORDER — KETOROLAC TROMETHAMINE 60 MG/2ML IM SOLN
30.0000 mg | Freq: Once | INTRAMUSCULAR | Status: AC
  Administered 2023-10-08: 30 mg via INTRAMUSCULAR
  Filled 2023-10-08: qty 2

## 2023-10-08 MED ORDER — ONDANSETRON 8 MG PO TBDP: ORAL_TABLET | ORAL | 0 refills | Status: AC

## 2023-10-08 MED ORDER — ALUM & MAG HYDROXIDE-SIMETH 200-200-20 MG/5ML PO SUSP
30.0000 mL | Freq: Once | ORAL | Status: AC
  Administered 2023-10-08: 30 mL via ORAL
  Filled 2023-10-08: qty 30

## 2023-10-08 NOTE — ED Provider Notes (Signed)
 [place] [facility]PARTMENT AT [facility] Provider Note   CSN: [id number] Arrival date & time: 10/08/23  0121     History  Chief Complaint  Patient presents with   Emesis   Generalized Body Aches    Kristin Lozano is a 58 y.o. female.  The history is provided by the patient.  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      Home Medications Prior to Admission medications   Medication Sig Start Date End Date Taking? Authorizing Provider  ondansetron  (ZOFRAN -ODT) 8 MG disintegrating tablet 8mg  ODT q8 hours prn nausea 10/08/23  Yes Katharina Jehle, MD  albuterol  (PROVENTIL ) (5 MG/ML) 0.5% nebulizer solution Take 0.5 mLs (2.5 mg total) by nebulization every 6 (six) hours as needed for wheezing or shortness of breath. 11/16/15   Raymundo Calk, MD  amLODipine  (NORVASC ) 10 MG tablet Take 1 tablet (10 mg total) by mouth daily. 11/16/15   Liu, Dana Duo, MD  atorvastatin (LIPITOR) 20 MG tablet Take 20 mg by mouth daily.    [provider]  azithromycin  (ZITHROMAX ) 250 MG tablet Take 1 tablet (250 mg total) by mouth daily. Take first 2 tablets together, then 1 every day until finished. Patient not taking: Reported on 03/29/2019 11/16/15   Liu, Dana Duo, MD  benzonatate  (TESSALON ) 100 MG capsule Take 1 capsule (100 mg total) by mouth every 8 (eight) hours. Patient not taking: Reported on 03/29/2019 11/16/15   Debbra Fairy, PA-C  buPROPion (ZYBAN) 150 MG 12 hr tablet Take 150 mg by mouth daily.    [provider]  cholecalciferol (VITAMIN D3) 25 MCG (1000 UT) tablet Take  1,000 Units by mouth once a week.    [provider]  citalopram (CELEXA) 40 MG tablet Take 40 mg by mouth daily.    [provider]  Fluticasone -Salmeterol (ADVAIR DISKUS) 500-50 MCG/DOSE AEPB Inhale 1 puff into the lungs 2 (two) times daily. Patient not taking: Reported on 03/29/2019 11/16/15   Liu, Dana Duo, MD  ibuprofen  (ADVIL ,MOTRIN ) 600 MG tablet Take 1 tablet (600 mg total) by mouth every 8 (eight) hours as needed for moderate pain. 07/12/18   Tonya Fredrickson, MD  loratadine  (CLARITIN ) 10 MG tablet Take 1 tablet (10 mg total) by mouth daily. Patient not taking: Reported on 03/29/2019 11/16/15   Raymundo Calk, MD  metoprolol succinate (TOPROL-XL) 50 MG 24 hr tablet Take 50 mg by mouth 2 (two) times daily. Take with or immediately following a meal.    [provider]  predniSONE  (DELTASONE ) 10 MG tablet Take 5 tablets (50 mg total) by mouth daily. Patient not taking: Reported on 03/29/2019 11/16/15   Raymundo Calk, MD  promethazine -dextromethorphan (PROMETHAZINE -DM) 6.25-15 MG/5ML syrup Take 5 mLs by mouth 4 (four) times daily as needed for cough. Patient not taking: Reported on 03/29/2019 11/16/15   Debbra Fairy, PA-C  spironolactone (ALDACTONE) 25 MG tablet Take 25 mg by mouth 2 (two) times daily. 11/08/15   [provider]  terconazole (TERAZOL 7) 0.4 % vaginal cream Place 1 applicator vaginally daily. For 7 days 11/14/15   [provider]  Allergies    Clindamycin/lincomycin and Sulfa antibiotics    Review of Systems   Review of Systems  Constitutional:  Negative for fever.  Gastrointestinal:  Positive for nausea and vomiting.       Loose stool   Musculoskeletal:  Positive for myalgias.  All other systems reviewed and are negative.   Physical Exam Updated Vital Signs BP 125/65 (BP Location: Right Arm)   Pulse 90   Temp 98 F (36.7 C) (Oral)   Resp 18   SpO2 98%  Physical Exam Vitals and nursing note reviewed.  Constitutional:       General: She is not in acute distress.    Appearance: She is well-developed.  HENT:     Head: Normocephalic and atraumatic.     Nose: Nose normal.  Eyes:     Pupils: Pupils are equal, round, and reactive to light.  Cardiovascular:     Rate and Rhythm: Normal rate and regular rhythm.     Pulses: Normal pulses.     Heart sounds: Normal heart sounds.  Pulmonary:     Effort: Pulmonary effort is normal. No respiratory distress.     Breath sounds: Normal breath sounds.  Abdominal:     General: Bowel sounds are normal. There is no distension.     Palpations: Abdomen is soft.     Tenderness: There is no abdominal tenderness. There is no guarding or rebound.  Musculoskeletal:        General: Normal range of motion.     Cervical back: Neck supple.  Skin:    General: Skin is warm and dry.     Capillary Refill: Capillary refill takes less than 2 seconds.     Findings: No erythema or rash.  Neurological:     General: No focal deficit present.     Mental Status: She is oriented to person, place, and time.     Deep Tendon Reflexes: Reflexes normal.  Psychiatric:        Mood and Affect: Mood normal.     ED Results / Procedures / Treatments   Labs (all labs ordered are listed, but only abnormal results are displayed) Results for orders placed or performed during the hospital encounter of 10/08/23  Resp panel by RT-PCR (RSV, Flu A&B, Covid) Anterior Nasal Swab   Collection Time: 10/08/23  1:34 AM   Specimen: Anterior Nasal Swab  Result Value Ref Range   SARS Coronavirus 2 by RT PCR NEGATIVE NEGATIVE   Influenza A by PCR NEGATIVE NEGATIVE   Influenza B by PCR NEGATIVE NEGATIVE   Resp Syncytial Virus by PCR NEGATIVE NEGATIVE   No results found.  Radiology No results found.  Procedures Procedures    Medications Ordered in ED Medications  ondansetron  (ZOFRAN -ODT) disintegrating tablet 8 mg (8 mg Oral Given 10/08/23 0131)  ketorolac  (TORADOL ) injection 30 mg (30 mg Intramuscular  Given 10/08/23 0330)  alum & mag hydroxide-simeth (MAALOX/MYLANTA) 200-200-20 MG/5ML suspension 30 mL (30 mLs Oral Given 10/08/23 0331)    ED Course/ Medical Decision Making/ A&P                                 Medical Decision Making Patient works in a middle school.  Body aches n/v/ loose stool   Amount and/or Complexity of Data Reviewed External Data Reviewed: notes.    Details: Previous notes reviewed  Labs: ordered.    Details: Negative covid and flu  Risk OTC drugs. Prescription drug management. Risk Details: Well appearing, consistent with viral illness. Will start zofran .  PO challenged in the ED without difficulty.  Stable for discharge.      Final Clinical Impression(s) / ED Diagnoses Final diagnoses:  Nausea and vomiting, unspecified vomiting type  Loose stools   No signs of systemic illness or infection. The patient is nontoxic-appearing on exam and vital signs are within normal limits.  I have reviewed the triage vital signs and the nursing notes. Pertinent labs & imaging results that were available during my care of the patient were reviewed by me and considered in my medical decision making (see chart for details). After history, exam, and medical workup I feel the patient has been appropriately medically screened and is safe for discharge home. Pertinent diagnoses were discussed with the patient. Patient was given return precautions.    Rx / DC Orders ED Discharge Orders          Ordered    ondansetron  (ZOFRAN -ODT) 8 MG disintegrating tablet        10/08/23 0449              Khaliq Turay, MD 10/08/23 0451

## 2023-10-08 NOTE — ED Triage Notes (Signed)
 Pt with generalized body aches, chills, and vomiting since 2pm today.  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  No diarrhea or abdominal pain.

## 2023-10-09 ENCOUNTER — Ambulatory Visit
Admission: RE | Admit: 2023-10-09 | Discharge: 2023-10-09 | Disposition: A | Discharge status: Home / Self Care | Source: Ambulatory Visit | Attending: Internal Medicine | Admitting: Internal Medicine

## 2023-10-09 ENCOUNTER — Other Ambulatory Visit: Payer: Self-pay | Admitting: Internal Medicine

## 2023-10-09 DIAGNOSIS — M25551 Pain in right hip: Secondary | ICD-10-CM

## 2023-12-15 ENCOUNTER — Ambulatory Visit: Admitting: Podiatry
